# Patient Record
Sex: Female | Born: 1985 | Race: White | Hispanic: No | Marital: Married | Smoking: Never smoker
Health system: Southern US, Community
[De-identification: ages and names within clinical notes are randomized; demographics above are authoritative.]

## PROBLEM LIST (undated history)

## (undated) DIAGNOSIS — S0630AA Unspecified focal traumatic brain injury with loss of consciousness status unknown, initial encounter: Secondary | ICD-10-CM

## (undated) DIAGNOSIS — S060XAA Concussion with loss of consciousness status unknown, initial encounter: Secondary | ICD-10-CM

## (undated) DIAGNOSIS — S069X9A Unspecified intracranial injury with loss of consciousness of unspecified duration, initial encounter: Secondary | ICD-10-CM

## (undated) DIAGNOSIS — S060X9A Concussion with loss of consciousness of unspecified duration, initial encounter: Secondary | ICD-10-CM

---
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
## 2005-03-04 ENCOUNTER — Emergency Department (HOSPITAL_COMMUNITY): Admission: EM | Admit: 2005-03-04 | Discharge: 2005-03-04 | Payer: Self-pay | Admitting: Emergency Medicine

## 2005-04-19 ENCOUNTER — Emergency Department (HOSPITAL_COMMUNITY): Admission: EM | Admit: 2005-04-19 | Discharge: 2005-04-19 | Payer: Self-pay | Admitting: Emergency Medicine

## 2005-06-26 ENCOUNTER — Emergency Department (HOSPITAL_COMMUNITY): Admission: EM | Admit: 2005-06-26 | Discharge: 2005-06-26 | Payer: Self-pay | Admitting: Emergency Medicine

## 2005-09-21 ENCOUNTER — Emergency Department (HOSPITAL_COMMUNITY): Admission: EM | Admit: 2005-09-21 | Discharge: 2005-09-21 | Payer: Self-pay | Admitting: Emergency Medicine

## 2006-01-28 ENCOUNTER — Emergency Department (HOSPITAL_COMMUNITY): Admission: EM | Admit: 2006-01-28 | Discharge: 2006-01-29 | Payer: Self-pay | Admitting: Emergency Medicine

## 2007-09-25 ENCOUNTER — Encounter: Admission: RE | Admit: 2007-09-25 | Discharge: 2007-12-24 | Payer: Self-pay | Admitting: Psychology

## 2010-02-02 ENCOUNTER — Emergency Department (HOSPITAL_COMMUNITY): Admission: EM | Admit: 2010-02-02 | Discharge: 2010-02-03 | Payer: Self-pay | Admitting: Emergency Medicine

## 2013-08-05 NOTE — L&D Delivery Note (Signed)
Patient was C/C/+2 and pushed for <5 minutes with epidural.   NSVD  female infant, Apgars/weight pending The patient had 2 small bilateral vaginal wall lacerations repaired with 2 figures of eight 3-0 vicryl. Fundus was firm. EBL was expected amount. Placenta was delivered intact. Vagina was clear.  Baby was vigorous and doing skin to skin with mother.  Philip AspenALLAHAN, Skarlet Lyons

## 2014-04-28 LAB — OB RESULTS CONSOLE GC/CHLAMYDIA
Chlamydia: NEGATIVE
Gonorrhea: NEGATIVE

## 2014-05-05 LAB — OB RESULTS CONSOLE HIV ANTIBODY (ROUTINE TESTING): HIV: NONREACTIVE

## 2014-05-05 LAB — OB RESULTS CONSOLE ABO/RH: RH Type: POSITIVE

## 2014-05-05 LAB — OB RESULTS CONSOLE RUBELLA ANTIBODY, IGM: RUBELLA: IMMUNE

## 2014-05-05 LAB — OB RESULTS CONSOLE ANTIBODY SCREEN: Antibody Screen: NEGATIVE

## 2014-05-05 LAB — OB RESULTS CONSOLE RPR: RPR: NONREACTIVE

## 2014-05-05 LAB — OB RESULTS CONSOLE HEPATITIS B SURFACE ANTIGEN: Hepatitis B Surface Ag: NEGATIVE

## 2014-05-11 ENCOUNTER — Encounter (HOSPITAL_COMMUNITY): Payer: Self-pay | Admitting: Emergency Medicine

## 2014-05-11 ENCOUNTER — Emergency Department (HOSPITAL_COMMUNITY): Payer: Medicaid Other

## 2014-05-11 ENCOUNTER — Emergency Department (HOSPITAL_COMMUNITY)
Admission: EM | Admit: 2014-05-11 | Discharge: 2014-05-11 | Disposition: A | Discharge status: Home / Self Care | Payer: Medicaid Other | Attending: Emergency Medicine | Admitting: Emergency Medicine

## 2014-05-11 DIAGNOSIS — N859 Noninflammatory disorder of uterus, unspecified: Secondary | ICD-10-CM

## 2014-05-11 DIAGNOSIS — Z79899 Other long term (current) drug therapy: Secondary | ICD-10-CM | POA: Diagnosis not present

## 2014-05-11 DIAGNOSIS — N858 Other specified noninflammatory disorders of uterus: Secondary | ICD-10-CM | POA: Diagnosis not present

## 2014-05-11 DIAGNOSIS — Z7951 Long term (current) use of inhaled steroids: Secondary | ICD-10-CM | POA: Diagnosis not present

## 2014-05-11 DIAGNOSIS — Z3A27 27 weeks gestation of pregnancy: Secondary | ICD-10-CM | POA: Insufficient documentation

## 2014-05-11 DIAGNOSIS — O26893 Other specified pregnancy related conditions, third trimester: Secondary | ICD-10-CM | POA: Insufficient documentation

## 2014-05-11 LAB — CBC WITH DIFFERENTIAL/PLATELET
BASOS ABS: 0 10*3/uL (ref 0.0–0.1)
BASOS PCT: 0 % (ref 0–1)
EOS ABS: 0.2 10*3/uL (ref 0.0–0.7)
EOS PCT: 1 % (ref 0–5)
HEMATOCRIT: 32.9 % — AB (ref 36.0–46.0)
Hemoglobin: 11.1 g/dL — ABNORMAL LOW (ref 12.0–15.0)
LYMPHS ABS: 2 10*3/uL (ref 0.7–4.0)
Lymphocytes Relative: 17 % (ref 12–46)
MCH: 29.9 pg (ref 26.0–34.0)
MCHC: 33.7 g/dL (ref 30.0–36.0)
MCV: 88.7 fL (ref 78.0–100.0)
MONOS PCT: 3 % (ref 3–12)
Monocytes Absolute: 0.4 10*3/uL (ref 0.1–1.0)
NEUTROS ABS: 9.1 10*3/uL — AB (ref 1.7–7.7)
NEUTROS PCT: 79 % — AB (ref 43–77)
PLATELETS: 268 10*3/uL (ref 150–400)
RBC: 3.71 MIL/uL — AB (ref 3.87–5.11)
RDW: 12.5 % (ref 11.5–15.5)
WBC: 11.7 10*3/uL — AB (ref 4.0–10.5)

## 2014-05-11 LAB — URINALYSIS, ROUTINE W REFLEX MICROSCOPIC
BILIRUBIN URINE: NEGATIVE
Glucose, UA: NEGATIVE mg/dL
HGB URINE DIPSTICK: NEGATIVE
Ketones, ur: >80 mg/dL — AB
LEUKOCYTES UA: NEGATIVE
Nitrite: NEGATIVE
Protein, ur: NEGATIVE mg/dL
SPECIFIC GRAVITY, URINE: 1.011 (ref 1.005–1.030)
Urobilinogen, UA: 0.2 mg/dL (ref 0.0–1.0)
pH: 6.5 (ref 5.0–8.0)

## 2014-05-11 LAB — COMPREHENSIVE METABOLIC PANEL
ALBUMIN: 3.2 g/dL — AB (ref 3.5–5.2)
ALK PHOS: 69 U/L (ref 39–117)
ALT: 20 U/L (ref 0–35)
ANION GAP: 18 — AB (ref 5–15)
AST: 16 U/L (ref 0–37)
BUN: 7 mg/dL (ref 6–23)
CO2: 19 mEq/L (ref 19–32)
CREATININE: 0.37 mg/dL — AB (ref 0.50–1.10)
Calcium: 9.4 mg/dL (ref 8.4–10.5)
Chloride: 101 mEq/L (ref 96–112)
GFR calc Af Amer: >90 mL/min (ref >90)
GLUCOSE: 85 mg/dL (ref 70–99)
Potassium: 3.7 mEq/L (ref 3.7–5.3)
Sodium: 138 mEq/L (ref 137–147)
Total Protein: 7.3 g/dL (ref 6.0–8.3)

## 2014-05-11 MED ORDER — SODIUM CHLORIDE 0.9 % IV BOLUS (SEPSIS)
1500.0000 mL | Freq: Once | INTRAVENOUS | Status: AC
  Administered 2014-05-11: 1500 mL via INTRAVENOUS

## 2014-05-11 MED ORDER — ONDANSETRON 8 MG PO TBDP: 8.0000 mg | ORAL_TABLET | Freq: Three times a day (TID) | ORAL | Status: DC | PRN

## 2014-05-11 NOTE — ED Provider Notes (Signed)
CSN: [id number]     Arrival date & time 05/11/14  1618 History   First MD Initiated Contact with Patient 05/11/14 1701     Chief Complaint  Patient presents with  . Abdominal Pain  . Shortness of Breath      HPI Patient complains of sharp right-sided abdominal pain that began today shortly after intercourse.  She is [redacted] weeks pregnant.  She is a G1 P0.  She's had some nausea and vomiting today.  She has no urinary frequency.  She feels as though she has not felt her baby move in some time.  She denies vaginal bleeding or loss of fluid.  She's had an uneventful pregnancy thus far.  She states she and her fianc went for a walk she felt more short of breath than usual.  She denies unilateral leg swelling.  Her breathing feels fine now.  No history of asthma.  No history of DVT or pulmonary embolism.  She denies chest pains.  She denies pleuritic symptoms on my history.  History reviewed. No pertinent past medical history. History reviewed. No pertinent past surgical history. History reviewed. No pertinent family history. History  Substance Use Topics  . Smoking status: Never Smoker   . Smokeless tobacco: Not on file  . Alcohol Use: No   OB History   Grav Para Term Preterm Abortions TAB SAB Ect Mult Living   1              Review of Systems  All other systems reviewed and are negative.     Allergies  Azithromycin  Home Medications   Prior to Admission medications   Medication Sig Start Date End Date Taking? Authorizing Provider  desloratadine-pseudoephedrine (CLARINEX-D 12-HOUR) 2.5-120 MG per tablet Take 1 tablet by mouth 2 (two) times daily.   Yes Historical Provider, MD  mometasone (NASONEX) 50 MCG/ACT nasal spray Place 2 sprays into the nose daily.   Yes Historical Provider, MD  Prenat w/o A-FeCbn-DSS-FA-DHA (CITRANATAL HARMONY PO) Take 2 tablets by mouth daily.   Yes Historical Provider, MD   BP 138/77  Pulse 105  Temp(Src) 98.3 F (36.8 C) (Oral)  Resp 20  SpO2  99% Physical Exam  Nursing note and vitals reviewed. Constitutional: She is oriented to person, place, and time. She appears well-developed and well-nourished. No distress.  HENT:  Head: Normocephalic and atraumatic.  Eyes: EOM are normal.  Neck: Normal range of motion.  Cardiovascular: Normal rate, regular rhythm and normal heart sounds.   Pulmonary/Chest: Effort normal and breath sounds normal.  Abdominal: Soft. She exhibits no distension. There is no tenderness.  Gravid uterus consistent with dates  Musculoskeletal: Normal range of motion.  Neurological: She is alert and oriented to person, place, and time.  Skin: Skin is warm and dry.  Psychiatric: She has a normal mood and affect. Judgment normal.    ED Course  Procedures (including critical care time) Labs Review Labs Reviewed  CBC WITH DIFFERENTIAL - Abnormal; Notable for the following:    WBC 11.7 (*)    RBC 3.71 (*)    Hemoglobin 11.1 (*)    HCT 32.9 (*)    Neutrophils Relative % 79 (*)    Neutro Abs 9.1 (*)    All other components within normal limits  COMPREHENSIVE METABOLIC PANEL - Abnormal; Notable for the following:    Creatinine, Ser 0.37 (*)    Albumin 3.2 (*)    Total Bilirubin <0.2 (*)    Anion gap 18 (*)  All other components within normal limits  URINALYSIS, ROUTINE W REFLEX MICROSCOPIC - Abnormal; Notable for the following:    Ketones, ur >80 (*)    All other components within normal limits    Imaging Review Dg Chest 1 View  05/11/2014   CLINICAL DATA:  Shortness of breath.  Patient is [redacted] weeks pregnant.  EXAM: CHEST - 1 VIEW  COMPARISON:  None.  FINDINGS: The cardiac silhouette, mediastinal and hilar contours are normal. The lungs are clear. No pleural effusion the bony thorax is intact.  IMPRESSION: No acute cardiopulmonary findings.   Electronically Signed   By: Loralie ChampagneMark  Gallerani M.D.   On: 05/11/2014 19:09     EKG Interpretation None      MDM   Final diagnoses:  Uterine irritability     Bedside ultrasound demonstrates normal fetal movement and fetal heart rate of 146.  Patient was placed on continuous toco.  Rapid response OB nursing was at bedside.  Patient appears to have uterine irritability on her tocometry.  Decreased oral intake today.  Hydrated in the emergency department.  She's feeling much better at this time.  She ate food.  She ambulated without any difficulty.  She denies shortness of breath.  No hypoxia.  Discharge home in good condition.  She understands to return to the ER for new or worsening symptoms    Lyanne CoKevin M Enis Riecke, MD 05/11/14 2042

## 2014-05-11 NOTE — ED Notes (Signed)
OB RR nurse at bedside 

## 2014-05-11 NOTE — Progress Notes (Signed)
FHT 145bpm, 10x10 accels, no decels. No new contractions detected. Pt states she feels much better. Pt off monitor to eat and walk.

## 2014-05-11 NOTE — ED Notes (Signed)
Pt presented to ER with c/o SOB (since 1200) and decreased FM.(since 1400).  Pt has also had N/V.  Pt denies and vaginal bleeding or discharge.  Pt C/O sharp pain in LRQ.  Pt presently on monitor since 1624 baseline fhr 145, minimal varability, no accels, no decels, no uc per EFM or palpation

## 2014-05-11 NOTE — ED Notes (Signed)
Not[facility] Dr Claiborne Billingsallahan [facility]  MD would like RN to perform a cervical exam.

## 2014-05-11 NOTE — ED Notes (Signed)
FHR 145, minimum to moderate varibility no accels, no decels, uc noted q 3-6 min, 40 sec duration and mild to palpation.  Discussed fhr tracing with MD

## 2014-05-11 NOTE — ED Notes (Signed)
Pt states that she is [redacted] wks pregnant.  Started having LLQ pain w/ vomiting and SOB on deep inspiration about noon today.

## 2014-06-06 ENCOUNTER — Encounter (HOSPITAL_COMMUNITY): Payer: Self-pay | Admitting: Emergency Medicine

## 2014-07-18 LAB — OB RESULTS CONSOLE GBS: STREP GROUP B AG: NEGATIVE

## 2014-07-25 ENCOUNTER — Inpatient Hospital Stay (HOSPITAL_COMMUNITY)
Admission: AD | Admit: 2014-07-25 | Discharge: 2014-07-27 | DRG: 775 | Disposition: A | Discharge status: Home / Self Care | Payer: Medicaid Other | Source: Ambulatory Visit | Attending: Obstetrics & Gynecology | Admitting: Obstetrics & Gynecology

## 2014-07-25 ENCOUNTER — Inpatient Hospital Stay (HOSPITAL_COMMUNITY): Payer: Medicaid Other | Admitting: Anesthesiology

## 2014-07-25 ENCOUNTER — Encounter (HOSPITAL_COMMUNITY): Payer: Self-pay | Admitting: *Deleted

## 2014-07-25 DIAGNOSIS — Z3A38 38 weeks gestation of pregnancy: Secondary | ICD-10-CM | POA: Diagnosis present

## 2014-07-25 DIAGNOSIS — Z3403 Encounter for supervision of normal first pregnancy, third trimester: Secondary | ICD-10-CM | POA: Diagnosis present

## 2014-07-25 HISTORY — DX: Concussion with loss of consciousness of unspecified duration, initial encounter: S06.0X9A

## 2014-07-25 HISTORY — DX: Unspecified intracranial injury with loss of consciousness of unspecified duration, initial encounter: S06.9X9A

## 2014-07-25 HISTORY — DX: Unspecified focal traumatic brain injury with loss of consciousness status unknown, initial encounter: S06.30AA

## 2014-07-25 HISTORY — DX: Concussion with loss of consciousness status unknown, initial encounter: S06.0XAA

## 2014-07-25 LAB — POCT FERN TEST
POCT FERN TEST: POSITIVE
POCT Fern Test: NEGATIVE

## 2014-07-25 LAB — COMPREHENSIVE METABOLIC PANEL
ALBUMIN: 2.7 g/dL — AB (ref 3.5–5.2)
ALT: 50 U/L — AB (ref 0–35)
AST: 36 U/L (ref 0–37)
Alkaline Phosphatase: 205 U/L — ABNORMAL HIGH (ref 39–117)
Anion gap: 14 (ref 5–15)
BUN: 14 mg/dL (ref 6–23)
CALCIUM: 9.4 mg/dL (ref 8.4–10.5)
CO2: 21 mEq/L (ref 19–32)
CREATININE: 0.64 mg/dL (ref 0.50–1.10)
Chloride: 104 mEq/L (ref 96–112)
GFR calc Af Amer: >90 mL/min (ref >90)
GFR calc non Af Amer: >90 mL/min (ref >90)
Glucose, Bld: 78 mg/dL (ref 70–99)
Potassium: 4 mEq/L (ref 3.7–5.3)
Sodium: 139 mEq/L (ref 137–147)
Total Bilirubin: <0.2 mg/dL — ABNORMAL LOW (ref 0.3–1.2)
Total Protein: 5.9 g/dL — ABNORMAL LOW (ref 6.0–8.3)

## 2014-07-25 LAB — CBC
HCT: 32.6 % — ABNORMAL LOW (ref 36.0–46.0)
HEMOGLOBIN: 11 g/dL — AB (ref 12.0–15.0)
MCH: 30.1 pg (ref 26.0–34.0)
MCHC: 33.7 g/dL (ref 30.0–36.0)
MCV: 89.3 fL (ref 78.0–100.0)
PLATELETS: 195 10*3/uL (ref 150–400)
RBC: 3.65 MIL/uL — ABNORMAL LOW (ref 3.87–5.11)
RDW: 14.1 % (ref 11.5–15.5)
WBC: 10.2 10*3/uL (ref 4.0–10.5)

## 2014-07-25 LAB — TYPE AND SCREEN
ABO/RH(D): A POS
ANTIBODY SCREEN: NEGATIVE

## 2014-07-25 LAB — RPR

## 2014-07-25 LAB — ABO/RH: ABO/RH(D): A POS

## 2014-07-25 MED ORDER — OXYCODONE-ACETAMINOPHEN 5-325 MG PO TABS: 2.0000 | ORAL_TABLET | ORAL | Status: DC | PRN

## 2014-07-25 MED ORDER — FENTANYL 2.5 MCG/ML BUPIVACAINE 1/10 % EPIDURAL INFUSION (WH - ANES)
INTRAMUSCULAR | Status: AC
  Filled 2014-07-25: qty 125

## 2014-07-25 MED ORDER — PHENYLEPHRINE 40 MCG/ML (10ML) SYRINGE FOR IV PUSH (FOR BLOOD PRESSURE SUPPORT)
80.0000 ug | PREFILLED_SYRINGE | INTRAVENOUS | Status: DC | PRN
  Filled 2014-07-25: qty 2

## 2014-07-25 MED ORDER — ONDANSETRON HCL 4 MG/2ML IJ SOLN: 4.0000 mg | INTRAMUSCULAR | Status: DC | PRN

## 2014-07-25 MED ORDER — FENTANYL 2.5 MCG/ML BUPIVACAINE 1/10 % EPIDURAL INFUSION (WH - ANES)
INTRAMUSCULAR | Status: DC | PRN
  Administered 2014-07-25: 14 mL/h via EPIDURAL

## 2014-07-25 MED ORDER — PRENATAL MULTIVITAMIN CH
1.0000 | ORAL_TABLET | Freq: Every day | ORAL | Status: DC
  Administered 2014-07-26 – 2014-07-27 (×2): 1 via ORAL
  Filled 2014-07-25 (×2): qty 1

## 2014-07-25 MED ORDER — OXYTOCIN 40 UNITS IN LACTATED RINGERS INFUSION - SIMPLE MED
1.0000 m[IU]/min | INTRAVENOUS | Status: DC
  Administered 2014-07-25: 4 m[IU]/min via INTRAVENOUS
  Administered 2014-07-25: 8 m[IU]/min via INTRAVENOUS
  Administered 2014-07-25: 6 m[IU]/min via INTRAVENOUS
  Administered 2014-07-25: 10 m[IU]/min via INTRAVENOUS
  Administered 2014-07-25: 12 m[IU]/min via INTRAVENOUS
  Administered 2014-07-25: 2 m[IU]/min via INTRAVENOUS

## 2014-07-25 MED ORDER — LIDOCAINE HCL (PF) 1 % IJ SOLN
30.0000 mL | INTRAMUSCULAR | Status: DC | PRN
  Filled 2014-07-25: qty 30

## 2014-07-25 MED ORDER — ACETAMINOPHEN 325 MG PO TABS
650.0000 mg | ORAL_TABLET | ORAL | Status: DC | PRN
  Administered 2014-07-25 (×2): 650 mg via ORAL
  Filled 2014-07-25 (×2): qty 2

## 2014-07-25 MED ORDER — LACTATED RINGERS IV SOLN
INTRAVENOUS | Status: DC
  Administered 2014-07-25 (×2): via INTRAVENOUS

## 2014-07-25 MED ORDER — DIPHENHYDRAMINE HCL 25 MG PO CAPS: 25.0000 mg | ORAL_CAPSULE | Freq: Four times a day (QID) | ORAL | Status: DC | PRN

## 2014-07-25 MED ORDER — ONDANSETRON HCL 4 MG/2ML IJ SOLN
4.0000 mg | Freq: Four times a day (QID) | INTRAMUSCULAR | Status: DC | PRN
  Administered 2014-07-25: 4 mg via INTRAVENOUS
  Filled 2014-07-25: qty 2

## 2014-07-25 MED ORDER — LACTATED RINGERS IV SOLN
500.0000 mL | Freq: Once | INTRAVENOUS | Status: AC
  Administered 2014-07-25: 500 mL via INTRAVENOUS

## 2014-07-25 MED ORDER — TERBUTALINE SULFATE 1 MG/ML IJ SOLN: 0.2500 mg | Freq: Once | INTRAMUSCULAR | Status: DC | PRN

## 2014-07-25 MED ORDER — CITRIC ACID-SODIUM CITRATE 334-500 MG/5ML PO SOLN: 30.0000 mL | ORAL | Status: DC | PRN

## 2014-07-25 MED ORDER — OXYCODONE-ACETAMINOPHEN 5-325 MG PO TABS: 1.0000 | ORAL_TABLET | ORAL | Status: DC | PRN

## 2014-07-25 MED ORDER — FLEET ENEMA 7-19 GM/118ML RE ENEM: 1.0000 | ENEMA | RECTAL | Status: DC | PRN

## 2014-07-25 MED ORDER — FENTANYL 2.5 MCG/ML BUPIVACAINE 1/10 % EPIDURAL INFUSION (WH - ANES)
14.0000 mL/h | INTRAMUSCULAR | Status: DC | PRN
  Administered 2014-07-25 (×2): 14 mL/h via EPIDURAL
  Filled 2014-07-25: qty 125

## 2014-07-25 MED ORDER — LANOLIN HYDROUS EX OINT: TOPICAL_OINTMENT | CUTANEOUS | Status: DC | PRN

## 2014-07-25 MED ORDER — BENZOCAINE-MENTHOL 20-0.5 % EX AERO
1.0000 "application " | INHALATION_SPRAY | CUTANEOUS | Status: DC | PRN
  Administered 2014-07-26 – 2014-07-27 (×2): 1 via TOPICAL
  Filled 2014-07-25 (×2): qty 56

## 2014-07-25 MED ORDER — IBUPROFEN 600 MG PO TABS
600.0000 mg | ORAL_TABLET | Freq: Four times a day (QID) | ORAL | Status: DC
  Administered 2014-07-26 – 2014-07-27 (×7): 600 mg via ORAL
  Filled 2014-07-25 (×7): qty 1

## 2014-07-25 MED ORDER — DIPHENHYDRAMINE HCL 50 MG/ML IJ SOLN: 12.5000 mg | INTRAMUSCULAR | Status: DC | PRN

## 2014-07-25 MED ORDER — OXYCODONE-ACETAMINOPHEN 5-325 MG PO TABS
1.0000 | ORAL_TABLET | ORAL | Status: DC | PRN
  Administered 2014-07-26: 1 via ORAL
  Filled 2014-07-25: qty 1

## 2014-07-25 MED ORDER — SIMETHICONE 80 MG PO CHEW: 80.0000 mg | CHEWABLE_TABLET | ORAL | Status: DC | PRN

## 2014-07-25 MED ORDER — BUTORPHANOL TARTRATE 1 MG/ML IJ SOLN
1.0000 mg | INTRAMUSCULAR | Status: DC | PRN
  Administered 2014-07-25 (×2): 1 mg via INTRAVENOUS
  Filled 2014-07-25 (×2): qty 1

## 2014-07-25 MED ORDER — EPHEDRINE 5 MG/ML INJ
10.0000 mg | INTRAVENOUS | Status: DC | PRN
  Filled 2014-07-25: qty 2

## 2014-07-25 MED ORDER — OXYTOCIN 40 UNITS IN LACTATED RINGERS INFUSION - SIMPLE MED
62.5000 mL/h | INTRAVENOUS | Status: DC
Start: 2014-07-25 — End: 2014-07-25
  Administered 2014-07-25: 62.5 mL/h via INTRAVENOUS
  Filled 2014-07-25: qty 1000

## 2014-07-25 MED ORDER — OXYTOCIN BOLUS FROM INFUSION
500.0000 mL | INTRAVENOUS | Status: DC
Start: 2014-07-25 — End: 2014-07-25
  Administered 2014-07-25: 500 mL via INTRAVENOUS

## 2014-07-25 MED ORDER — ONDANSETRON HCL 4 MG PO TABS: 4.0000 mg | ORAL_TABLET | ORAL | Status: DC | PRN

## 2014-07-25 MED ORDER — DIBUCAINE 1 % RE OINT: 1.0000 "application " | TOPICAL_OINTMENT | RECTAL | Status: DC | PRN

## 2014-07-25 MED ORDER — ZOLPIDEM TARTRATE 5 MG PO TABS: 5.0000 mg | ORAL_TABLET | Freq: Every evening | ORAL | Status: DC | PRN

## 2014-07-25 MED ORDER — SENNOSIDES-DOCUSATE SODIUM 8.6-50 MG PO TABS
2.0000 | ORAL_TABLET | ORAL | Status: DC
  Administered 2014-07-26 – 2014-07-27 (×2): 2 via ORAL
  Filled 2014-07-25 (×2): qty 2

## 2014-07-25 MED ORDER — LIDOCAINE HCL (PF) 1 % IJ SOLN
INTRAMUSCULAR | Status: DC | PRN
  Administered 2014-07-25 (×2): 8 mL

## 2014-07-25 MED ORDER — PHENYLEPHRINE 40 MCG/ML (10ML) SYRINGE FOR IV PUSH (FOR BLOOD PRESSURE SUPPORT)
PREFILLED_SYRINGE | INTRAVENOUS | Status: AC
  Filled 2014-07-25: qty 10

## 2014-07-25 MED ORDER — OXYCODONE-ACETAMINOPHEN 5-325 MG PO TABS
2.0000 | ORAL_TABLET | ORAL | Status: DC | PRN
  Administered 2014-07-27 (×2): 2 via ORAL
  Filled 2014-07-25 (×2): qty 2

## 2014-07-25 MED ORDER — TETANUS-DIPHTH-ACELL PERTUSSIS 5-2.5-18.5 LF-MCG/0.5 IM SUSP: 0.5000 mL | Freq: Once | INTRAMUSCULAR | Status: DC

## 2014-07-25 MED ORDER — WITCH HAZEL-GLYCERIN EX PADS: 1.0000 "application " | MEDICATED_PAD | CUTANEOUS | Status: DC | PRN

## 2014-07-25 MED ORDER — LACTATED RINGERS IV SOLN: 500.0000 mL | INTRAVENOUS | Status: DC | PRN

## 2014-07-25 NOTE — Anesthesia Preprocedure Evaluation (Signed)
Anesthesia Evaluation  Patient identified by MRN, date of birth, ID band Patient awake    Reviewed: Allergy & Precautions, H&P , NPO status , Patient's Chart, lab work & pertinent test results  Airway Mallampati: II TM Distance: >3 FB Neck ROM: full    Dental no notable dental hx.    Pulmonary neg pulmonary ROS,  breath sounds clear to auscultation  Pulmonary exam normal       Cardiovascular negative cardio ROS      Neuro/Psych negative neurological ROS  negative psych ROS   GI/Hepatic negative GI ROS, Neg liver ROS,   Endo/Other  negative endocrine ROS  Renal/GU negative Renal ROS     Musculoskeletal   Abdominal (+) + obese,   Peds  Hematology negative hematology ROS (+)   Anesthesia Other Findings   Reproductive/Obstetrics (+) Pregnancy                           Anesthesia Physical Anesthesia Plan  ASA: II  Anesthesia Plan: Epidural   Post-op Pain Management:    Induction:   Airway Management Planned:   Additional Equipment:   Intra-op Plan:   Post-operative Plan:   Informed Consent: I have reviewed the patients History and Physical, chart, labs and discussed the procedure including the risks, benefits and alternatives for the proposed anesthesia with the patient or authorized representative who has indicated his/her understanding and acceptance.     Plan Discussed with:   Anesthesia Plan Comments:         Anesthesia Quick Evaluation  

## 2014-07-25 NOTE — MAU Note (Signed)
Contractions and leaking watery fluid. Denies vaginal bleeding. Positive fetal movement.

## 2014-07-25 NOTE — Progress Notes (Signed)
Dr Arby BarretteHatchett notified that no order was in place for repeat CBC for epidural removal in pt due to increased pressures throughout labor.  RN gave MD most current platelet level.  MD gave orders to remove epidural now without repeat CBC.

## 2014-07-25 NOTE — MAU Provider Note (Signed)
S: Peggy Conley is a 28 y.o. G1P0 at 6978w3d who presents today with leaking of fluid. She denies any VB. She confirms fetal movement. O: VSS, afebrile Abdomen: soft, non-tender, gravid External: no lesion Vagina: pooling of clear fluid  Cervix: pink, smooth Uterus: AGA FHT: 135, moderate with 15x15 accels, no decels Toco: irregular  Results for orders placed or performed during the hospital encounter of 07/25/14 (from the past 24 hour(s))  Fern Test     Status: None   Collection Time: 07/25/14  1:36 AM  Result Value Ref Range   POCT Fern Test Negative = intact amniotic membranes   Fern Test     Status: None   Collection Time: 07/25/14  1:52 AM  Result Value Ref Range   POCT Fern Test Positive = ruptured amniotic membanes    A/P: Exam for ROM RN will report to attending MD

## 2014-07-25 NOTE — Anesthesia Procedure Notes (Signed)
Epidural Patient location during procedure: OB Start time: 07/25/2014 7:48 AM End time: 07/25/2014 7:52 AM  Staffing Anesthesiologist: Leilani AbleHATCHETT, Nikiah Goin  Preanesthetic Checklist Completed: patient identified, surgical consent, pre-op evaluation, timeout performed, IV checked, risks and benefits discussed and monitors and equipment checked  Epidural Patient position: sitting Prep: site prepped and draped and DuraPrep Patient monitoring: continuous pulse ox and blood pressure Approach: midline Location: L3-L4 Injection technique: LOR air  Needle:  Needle type: Tuohy  Needle gauge: 17 G Needle length: 9 cm and 9 Needle insertion depth: 6 cm Catheter type: closed end flexible Catheter size: 19 Gauge Catheter at skin depth: 11 cm Test dose: negative and Other  Assessment Sensory level: T9 Events: blood not aspirated, injection not painful, no injection resistance, negative IV test and no paresthesia  Additional Notes Reason for block:procedure for pain

## 2014-07-25 NOTE — Progress Notes (Signed)
Dr. Mora ApplPinn notified by telephone of pt's b/p, uc pattern, and request for pain medicine.  Orders received.  RN may start pitocin in 1 hour if no change in cervix.

## 2014-07-25 NOTE — Progress Notes (Signed)
Dr. Mora ApplPinn notified of pt's abnormal labs. B/P's reported.  Oxytocin ordered no other orders at this time.

## 2014-07-25 NOTE — Lactation Note (Signed)
This note was copied from the chart of Girl Tera PartridgeSharon Dietz Giusto. Lactation Consultation Note Initial visit at 6 hours of age.  Mom reports a few good feedings and baby has been sleepy.  Mom reports taking a breastfeeding class last week and everything is still fresh in her mind.  Reviewed hand expression with colostrum visible.  [facility] resources given and discussed.  Encouraged to feed with early cues on demand.  Early newborn behavior discussed. Baby asleep on mom STS.    Mom to call for assist as needed.    Patient Name: Girl Tera PartridgeSharon Dietz Giusto [id number]'s Date: 07/25/2014 Reason for consult: Initial assessment   Maternal Data Has patient been taught Hand Expression?: Yes Does the patient have breastfeeding experience prior to this delivery?: No  Feeding    LATCH Score/Interventions Latch: Too sleepy or reluctant, no latch achieved, no sucking elicited. Intervention(s): Skin to skin;Teach feeding cues;Waking techniques Intervention(s): Adjust position;Assist with latch;Breast massage  Audible Swallowing: None Intervention(s): Skin to skin;Hand expression  Type of Nipple: Everted at rest and after stimulation  Comfort (Breast/Nipple): Soft / non-tender     Hold (Positioning): Assistance needed to correctly position infant at breast and maintain latch. Intervention(s): Breastfeeding basics reviewed;Support Pillows;Position options;Skin to skin  LATCH Score: 5  Lactation Tools Discussed/Used     Consult Status Consult Status: Follow-up Date: 07/26/14 Follow-up type: In-patient    Beverely RisenShoptaw, Arvella MerlesJana Lynn 07/25/2014, 11:36 PM

## 2014-07-26 LAB — CBC
HCT: 31 % — ABNORMAL LOW (ref 36.0–46.0)
Hemoglobin: 10.3 g/dL — ABNORMAL LOW (ref 12.0–15.0)
MCH: 29.7 pg (ref 26.0–34.0)
MCHC: 33.2 g/dL (ref 30.0–36.0)
MCV: 89.3 fL (ref 78.0–100.0)
PLATELETS: 178 10*3/uL (ref 150–400)
RBC: 3.47 MIL/uL — ABNORMAL LOW (ref 3.87–5.11)
RDW: 14.3 % (ref 11.5–15.5)
WBC: 11.9 10*3/uL — ABNORMAL HIGH (ref 4.0–10.5)

## 2014-07-26 NOTE — Lactation Note (Signed)
This note was copied from the chart of Peggy Conley. Lactation Consultation Note  Patient Name: Peggy Conley [id number]'s Date: 07/26/2014 Reason for consult: Follow-up assessment of this mom and baby, now 28 hours pp.  Mom reports that she was able to break suction and correct baby's latch when shallow latch noted and she states her baby is nursing well on cue.  LC encouraged cue feedings and discussed supply and demand and cluster feedings to bring in and increase her milk supply.  [facility].  Baby is breastfeeding exclusively for 13-27 minutes at a feeding and output is within guidelines for this hour of life.   Maternal Data    Feeding    LATCH Score/Interventions           LATCH score=7 per RN assessment           Lactation Tools Discussed/Used   Cue feedings, cluster feedings, supply and demand for milk production  Consult Status Consult Status: Follow-up Date: 07/27/14 Follow-up type: In-patient    Warrick ParisianBryant, Carolynne Schuchard [facility] 07/26/2014, 9:11 PM

## 2014-07-26 NOTE — Anesthesia Postprocedure Evaluation (Signed)
Anesthesia Post Note  Patient: Peggy RoadsSharon E Dietz Giusto  Procedure(s) Performed: * No procedures listed *  Anesthesia type: Epidural  Patient location: Mother/Baby  Post pain: Pain level controlled  Post assessment: Post-op Vital signs reviewed  Last Vitals:  Filed Vitals:   07/26/14 0449  BP: 130/76  Pulse: 94  Temp: 36.4 C  Resp: 19    Post vital signs: Reviewed  Level of consciousness:alert  Complications: No apparent anesthesia complications

## 2014-07-26 NOTE — Progress Notes (Signed)
UR chart review completed.  

## 2014-07-26 NOTE — H&P (Signed)
28 y.o. 5341w3d  G1P1001 comes in c/o leaking fluid.  Otherwise has good fetal movement and no bleeding.  Past Medical History  Diagnosis Date  . 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 Head concussion     x5   History reviewed. No pertinent past surgical history.  OB History  Gravida Para Term Preterm AB SAB TAB Ectopic Multiple Living  1 1 1       0 1    # Outcome Date GA Lbr Len/2nd Weight Sex Delivery Anes PTL Lv  1 Term 07/25/14 5841w3d 18:05 / 00:28 3.79 kg (8 lb 5.7 oz) F Vag-Spont EPI  Y      History   Social History  . Marital Status: Single    Spouse Name: N/A    Number of Children: N/A  . Years of Education: N/A   Occupational History  . Not on file.   Social History Main Topics  . Smoking status: Never Smoker   . Smokeless tobacco: Not on file  . Alcohol Use: No  . Drug Use: No  . Sexual Activity: Yes   Other Topics Concern  . Not on file   Social History Narrative   Azithromycin    Prenatal Transfer Tool  Maternal Diabetes: No Genetic Screening: Normal Maternal Ultrasounds/Referrals: Normal Fetal Ultrasounds or other Referrals:  None Maternal Substance Abuse:  No Significant Maternal Medications:  None Significant Maternal Lab Results: Lab values include: Group B Strep negative  Other PNC: uncomplicated.    Filed Vitals:   07/26/14 0449  BP: 130/76  Pulse: 94  Temp: 97.5 F (36.4 C)  Resp: 19     Lungs/Cor:  NAD Abdomen:  soft, gravid Ex:  no cords, erythema SVE:  1.5/80/-3 at admission FHTs:  135, good STV, +10x10 Toco:  q 4+   A/P   Admit with SROM  GBS Neg  If no progression on recheck will add pitocin augmentation  Other routine care  SheltonALLAHAN, [facility]

## 2014-07-26 NOTE — Progress Notes (Signed)
PPD#1 Pt without complaints, Lochia-mild VSSAF IMP/ Stable Plan/ Routine care

## 2014-07-27 MED ORDER — DOCUSATE SODIUM 100 MG PO CAPS: 100.0000 mg | ORAL_CAPSULE | Freq: Two times a day (BID) | ORAL | Status: AC

## 2014-07-27 MED ORDER — OXYCODONE-ACETAMINOPHEN 5-325 MG PO TABS: 2.0000 | ORAL_TABLET | ORAL | Status: DC | PRN

## 2014-07-27 MED ORDER — IBUPROFEN 600 MG PO TABS: 600.0000 mg | ORAL_TABLET | Freq: Four times a day (QID) | ORAL | Status: AC | PRN

## 2014-07-27 NOTE — Discharge Instructions (Signed)
Breastfeeding Deciding to breastfeed is one of the best choices you can make for you and your baby. A change in hormones during pregnancy causes your breast tissue to grow and increases the number and size of your milk ducts. These hormones also allow proteins, sugars, and fats from your blood supply to make breast milk in your milk-producing glands. Hormones prevent breast milk from being released before your baby is born as well as prompt milk flow after birth. Once breastfeeding has begun, thoughts of your baby, as well as his or her sucking or crying, can stimulate the release of milk from your milk-producing glands.  BENEFITS OF BREASTFEEDING For Your Baby  Your first milk (colostrum) helps your baby's digestive system function better.   There are antibodies in your milk that help your baby fight off infections.   Your baby has a lower incidence of asthma, allergies, and sudden infant death syndrome.   The nutrients in breast milk are better for your baby than infant formulas and are designed uniquely for your baby's needs.   Breast milk improves your baby's brain development.   Your baby is less likely to develop other conditions, such as childhood obesity, asthma, or type 2 diabetes mellitus.  For You   Breastfeeding helps to create a very special bond between you and your baby.   Breastfeeding is convenient. Breast milk is always available at the correct temperature and costs nothing.   Breastfeeding helps to burn calories and helps you lose the weight gained during pregnancy.   Breastfeeding makes your uterus contract to its prepregnancy size faster and slows bleeding (lochia) after you give birth.   Breastfeeding helps to lower your risk of developing type 2 diabetes mellitus, osteoporosis, and breast or ovarian cancer later in life. SIGNS THAT YOUR BABY IS HUNGRY Early Signs of Hunger  Increased alertness or activity.  Stretching.  Movement of the head from  side to side.  Movement of the head and opening of the mouth when the corner of the mouth or cheek is stroked (rooting).  Increased sucking sounds, smacking lips, cooing, sighing, or squeaking.  Hand-to-mouth movements.  Increased sucking of fingers or hands. Late Signs of Hunger  Fussing.  Intermittent crying. Extreme Signs of Hunger Signs of extreme hunger will require calming and consoling before your baby will be able to breastfeed successfully. Do not wait for the following signs of extreme hunger to occur before you initiate breastfeeding:   Restlessness.  A loud, strong cry.   Screaming. BREASTFEEDING BASICS Breastfeeding Initiation  Find a comfortable place to sit or lie down, with your neck and back well supported.  Place a pillow or rolled up blanket under your baby to bring him or her to the level of your breast (if you are seated). Nursing pillows are specially designed to help support your arms and your baby while you breastfeed.  Make sure that your baby's abdomen is facing your abdomen.   Gently massage your breast. With your fingertips, massage from your chest wall toward your nipple in a circular motion. This encourages milk flow. You may need to continue this action during the feeding if your milk flows slowly.  Support your breast with 4 fingers underneath and your thumb above your nipple. Make sure your fingers are well away from your nipple and your baby's mouth.   Stroke your baby's lips gently with your finger or nipple.   When your baby's mouth is open wide enough, quickly bring your baby to your  breast, placing your entire nipple and as much of the colored area around your nipple (areola) as possible into your baby's mouth.   More areola should be visible above your baby's upper lip than below the lower lip.   Your baby's tongue should be between his or her lower gum and your breast.   Ensure that your baby's mouth is correctly positioned  around your nipple (latched). Your baby's lips should create a seal on your breast and be turned out (everted).  It is common for your baby to suck about 2-3 minutes in order to start the flow of breast milk. Latching Teaching your baby how to latch on to your breast properly is very important. An improper latch can cause nipple pain and decreased milk supply for you and poor weight gain in your baby. Also, if your baby is not latched onto your nipple properly, he or she may swallow some air during feeding. This can make your baby fussy. Burping your baby when you switch breasts during the feeding can help to get rid of the air. However, teaching your baby to latch on properly is still the best way to prevent fussiness from swallowing air while breastfeeding. Signs that your baby has successfully latched on to your nipple:    Silent tugging or silent sucking, without causing you pain.   Swallowing heard between every 3-4 sucks.    Muscle movement above and in front of his or her ears while sucking.  Signs that your baby has not successfully latched on to nipple:   Sucking sounds or smacking sounds from your baby while breastfeeding.  Nipple pain. If you think your baby has not latched on correctly, slip your finger into the corner of your baby's mouth to break the suction and place it between your baby's gums. Attempt breastfeeding initiation again. Signs of Successful Breastfeeding Signs from your baby:   A gradual decrease in the number of sucks or complete cessation of sucking.   Falling asleep.   Relaxation of his or her body.   Retention of a small amount of milk in his or her mouth.   Letting go of your breast by himself or herself. Signs from you:  Breasts that have increased in firmness, weight, and size 1-3 hours after feeding.   Breasts that are softer immediately after breastfeeding.  Increased milk volume, as well as a change in milk consistency and color by  the fifth day of breastfeeding.   Nipples that are not sore, cracked, or bleeding. Signs That Your Baby is Getting Enough Milk  Wetting at least 3 diapers in a 24-hour period. The urine should be clear and pale yellow by age 5 days.  At least 3 stools in a 24-hour period by age 5 days. The stool should be soft and yellow.  At least 3 stools in a 24-hour period by age 7 days. The stool should be seedy and yellow.  No loss of weight greater than 10% of birth weight during the first 3 days of age.  Average weight gain of 4-7 ounces (113-198 g) per week after age 4 days.  Consistent daily weight gain by age 5 days, without weight loss after the age of 2 weeks. After a feeding, your baby may spit up a small amount. This is common. BREASTFEEDING FREQUENCY AND DURATION Frequent feeding will help you make more milk and can prevent sore nipples and breast engorgement. Breastfeed when you feel the need to reduce the fullness of your breasts   or when your baby shows signs of hunger. This is called "breastfeeding on demand." Avoid introducing a pacifier to your baby while you are working to establish breastfeeding (the first 4-6 weeks after your baby is born). After this time you may choose to use a pacifier. Research has shown that pacifier use during the first year of a baby's life decreases the risk of sudden infant death syndrome (SIDS). Allow your baby to feed on each breast as long as he or she wants. Breastfeed until your baby is finished feeding. When your baby unlatches or falls asleep while feeding from the first breast, offer the second breast. Because newborns are often sleepy in the first few weeks of life, you may need to awaken your baby to get him or her to feed. Breastfeeding times will vary from baby to baby. However, the following rules can serve as a guide to help you ensure that your baby is properly fed:  Newborns (babies 59 weeks of age or younger) may breastfeed every 1-3  hours.  Newborns should not go longer than 3 hours during the day or 5 hours during the night without breastfeeding.  You should breastfeed your baby a minimum of 8 times in a 24-hour period until you begin to introduce solid foods to your baby at around 11 months of age. BREAST MILK PUMPING Pumping and storing breast milk allows you to ensure that your baby is exclusively fed your breast milk, even at times when you are unable to breastfeed. This is especially important if you are going back to work while you are still breastfeeding or when you are not able to be present during feedings. Your lactation consultant can give you guidelines on how long it is safe to store breast milk.  A breast pump is a machine that allows you to pump milk from your breast into a sterile bottle. The pumped breast milk can then be stored in a refrigerator or freezer. Some breast pumps are operated by hand, while others use electricity. Ask your lactation consultant which type will work best for you. Breast pumps can be purchased, but some hospitals and breastfeeding support groups lease breast pumps on a monthly basis. A lactation consultant can teach you how to hand express breast milk, if you prefer not to use a pump.  CARING FOR YOUR BREASTS WHILE YOU BREASTFEED Nipples can become dry, cracked, and sore while breastfeeding. The following recommendations can help keep your breasts moisturized and healthy:  Avoid using soap on your nipples.   Wear a supportive bra. Although not required, special nursing bras and tank tops are designed to allow access to your breasts for breastfeeding without taking off your entire bra or top. Avoid wearing underwire-style bras or extremely tight bras.  Air dry your nipples for 3-65minutes after each feeding.   Use only cotton bra pads to absorb leaked breast milk. Leaking of breast milk between feedings is normal.   Use lanolin on your nipples after breastfeeding. Lanolin helps to  maintain your skin's normal moisture barrier. If you use pure lanolin, you do not need to wash it off before feeding your baby again. Pure lanolin is not toxic to your baby. You may also hand express a few drops of breast milk and gently massage that milk into your nipples and allow the milk to air dry. In the first few weeks after giving birth, some women experience extremely full breasts (engorgement). Engorgement can make your breasts feel heavy, warm, and tender to the  touch. Engorgement peaks within 3-5 days after you give birth. The following recommendations can help ease engorgement:  Completely empty your breasts while breastfeeding or pumping. You may want to start by applying warm, moist heat (in the shower or with warm water-soaked hand towels) just before feeding or pumping. This increases circulation and helps the milk flow. If your baby does not completely empty your breasts while breastfeeding, pump any extra milk after he or she is finished.  Wear a snug bra (nursing or regular) or tank top for 1-2 days to signal your body to slightly decrease milk production.  Apply ice packs to your breasts, unless this is too uncomfortable for you.  Make sure that your baby is latched on and positioned properly while breastfeeding. If engorgement persists after 48 hours of following these recommendations, contact your health care provider or a Advertising copywriter. OVERALL HEALTH CARE RECOMMENDATIONS WHILE BREASTFEEDING  Eat healthy foods. Alternate between meals and snacks, eating 3 of each per day. Because what you eat affects your breast milk, some of the foods may make your baby more irritable than usual. Avoid eating these foods if you are sure that they are negatively affecting your baby.  Drink milk, fruit juice, and water to satisfy your thirst (about 10 glasses a day).   Rest often, relax, and continue to take your prenatal vitamins to prevent fatigue, stress, and anemia.  Continue  breast self-awareness checks.  Avoid chewing and smoking tobacco.  Avoid alcohol and drug use. Some medicines that may be harmful to your baby can pass through breast milk. It is important to ask your health care provider before taking any medicine, including all over-the-counter and prescription medicine as well as vitamin and herbal supplements. It is possible to become pregnant while breastfeeding. If birth control is desired, ask your health care provider about options that will be safe for your baby. SEEK MEDICAL CARE IF:   You feel like you want to stop breastfeeding or have become frustrated with breastfeeding.  You have painful breasts or nipples.  Your nipples are cracked or bleeding.  Your breasts are red, tender, or warm.  You have a swollen area on either breast.  You have a fever or chills.  You have nausea or vomiting.  You have drainage other than breast milk from your nipples.  Your breasts do not become full before feedings by the fifth day after you give birth.  You feel sad and depressed.  Your baby is too sleepy to eat well.  Your baby is having trouble sleeping.   Your baby is wetting less than 3 diapers in a 24-hour period.  Your baby has less than 3 stools in a 24-hour period.  Your baby's skin or the white part of his or her eyes becomes yellow.   Your baby is not gaining weight by 3 days of age. SEEK IMMEDIATE MEDICAL CARE IF:   Your baby is overly tired (lethargic) and does not want to wake up and feed.  Your baby develops an unexplained fever. Document Released: 07/22/2005 Document Revised: 07/27/2013 Document Reviewed: 01/13/2013 [facility] Patient Information 2015 [place], [place]. This information is not intended to replace advice given to you by your health care provider. Make sure you discuss any questions you have with your health care provider.  Breast Pumping Tips If you are breastfeeding, there may be times when you cannot feed your  baby directly. Returning to work or going on a trip are examples. Pumping allows you to store  breast milk and feed it to your baby later.  You may not get much milk when you first start to pump. Your breasts should start to make more after a few days. If you pump at the times you usually feed your baby, you may be able to keep making enough milk to feed your baby without also using formula. The more often you pump, the more milk your body will make. WHEN SHOULD I PUMP?   You can start to pump soon after you have your baby. Ask your doctor what is right for you and your baby.  If you are going back to work, start pumping a few weeks before. This gives you time to learn how to pump and to store a supply of milk.  When you are with your baby, feed your baby when he or she is hungry. Pump after each feeding.  When you are away from your baby for many hours, pump for about 15 minutes every 2-3 hours. Pump both breasts at the same time if you can.  If your baby has a formula feeding, make sure to pump close to the same time.  If you drink any alcohol, wait 2 hours before pumping. HOW DO I GET READY TO PUMP? Your let-down reflex is your body's natural reaction that makes your breast milk flow. It is easier to make your breast milk flow when you are relaxed. Try these things to help you relax:  Smell one of your baby's blankets or an item of clothing.  Look at a picture or video of your baby.  Sit in a quiet, private space.  Massage the breast you plan to pump.  Place soothing warmth on the breast.  Play relaxing music. WHAT ARE SOME BREAST PUMPING TIPS?  Wash your hands before you pump. You do not need to wash your nipples or breasts.  There are three ways to pump. You can:  Use your hand to massage and squeeze your breast.  Use a handheld manual pump.  Use an electric pump.  Make sure the suction cup on the breast pump is the right size. Place the suction cup directly over the  nipple. It can be painful or hurt your nipple if it is the wrong size or placed wrong.  Put a small amount of purified or modified lanolin on your nipple and areola if you are sore.  If you are using an electric pump, change the speed and suction power to be more comfortable.  You may need a different type of pump if pumping hurts or you do not get a lot of milk. Your doctor can help you pick what type of pump to use.  Keep a full water bottle near you always. Drinking lots of fluid helps you make more milk.  You can store your milk to use later. Pumped breast milk can be stored in a sealable, sterile container or plastic bag. Always put the date you pumped it on the container.  Milk can stay out at room temperature for up to 8 hours.  You can store your milk in the refrigerator for up to 8 days.  You can store your milk in the freezer for 3 months. Thaw frozen milk using warm water. Do not put it in the microwave.  Do not smoke. Ask your doctor for help. WHEN SHOULD I CALL MY DOCTOR?  You have a hard time pumping.  You are worried you do not make enough milk.  You have nipple pain,  soreness, or redness.  You want to take birth control pills. Document Released: 01/08/2008 Document Revised: 07/27/2013 Document Reviewed: 05/14/2013 [facility] Patient Information 2015 [place], [place]. This information is not intended to replace advice given to you by your health care provider. Make sure you discuss any questions you have with your health care provider.  Breastfeeding Deciding to breastfeed is one of the best choices you can make for you and your baby. A change in hormones during pregnancy causes your breast tissue to grow and increases the number and size of your milk ducts. These hormones also allow proteins, sugars, and fats from your blood supply to make breast milk in your milk-producing glands. Hormones prevent breast milk from being released before your baby is born as well as prompt  milk flow after birth. Once breastfeeding has begun, thoughts of your baby, as well as his or her sucking or crying, can stimulate the release of milk from your milk-producing glands.  BENEFITS OF BREASTFEEDING For Your Baby  Your first milk (colostrum) helps your baby's digestive system function better.   There are antibodies in your milk that help your baby fight off infections.   Your baby has a lower incidence of asthma, allergies, and sudden infant death syndrome.   The nutrients in breast milk are better for your baby than infant formulas and are designed uniquely for your baby's needs.   Breast milk improves your baby's brain development.   Your baby is less likely to develop other conditions, such as childhood obesity, asthma, or type 2 diabetes mellitus.  For You   Breastfeeding helps to create a very special bond between you and your baby.   Breastfeeding is convenient. Breast milk is always available at the correct temperature and costs nothing.   Breastfeeding helps to burn calories and helps you lose the weight gained during pregnancy.   Breastfeeding makes your uterus contract to its prepregnancy size faster and slows bleeding (lochia) after you give birth.   Breastfeeding helps to lower your risk of developing type 2 diabetes mellitus, osteoporosis, and breast or ovarian cancer later in life. SIGNS THAT YOUR BABY IS HUNGRY Early Signs of Hunger  Increased alertness or activity.  Stretching.  Movement of the head from side to side.  Movement of the head and opening of the mouth when the corner of the mouth or cheek is stroked (rooting).  Increased sucking sounds, smacking lips, cooing, sighing, or squeaking.  Hand-to-mouth movements.  Increased sucking of fingers or hands. Late Signs of Hunger  Fussing.  Intermittent crying. Extreme Signs of Hunger Signs of extreme hunger will require calming and consoling before your baby will be able to  breastfeed successfully. Do not wait for the following signs of extreme hunger to occur before you initiate breastfeeding:   Restlessness.  A loud, strong cry.   Screaming. BREASTFEEDING BASICS Breastfeeding Initiation  Find a comfortable place to sit or lie down, with your neck and back well supported.  Place a pillow or rolled up blanket under your baby to bring him or her to the level of your breast (if you are seated). Nursing pillows are specially designed to help support your arms and your baby while you breastfeed.  Make sure that your baby's abdomen is facing your abdomen.   Gently massage your breast. With your fingertips, massage from your chest wall toward your nipple in a circular motion. This encourages milk flow. You may need to continue this action during the feeding if your milk flows  slowly.  Support your breast with 4 fingers underneath and your thumb above your nipple. Make sure your fingers are well away from your nipple and your baby's mouth.   Stroke your baby's lips gently with your finger or nipple.   When your baby's mouth is open wide enough, quickly bring your baby to your breast, placing your entire nipple and as much of the colored area around your nipple (areola) as possible into your baby's mouth.   More areola should be visible above your baby's upper lip than below the lower lip.   Your baby's tongue should be between his or her lower gum and your breast.   Ensure that your baby's mouth is correctly positioned around your nipple (latched). Your baby's lips should create a seal on your breast and be turned out (everted).  It is common for your baby to suck about 2-3 minutes in order to start the flow of breast milk. Latching Teaching your baby how to latch on to your breast properly is very important. An improper latch can cause nipple pain and decreased milk supply for you and poor weight gain in your baby. Also, if your baby is not latched onto  your nipple properly, he or she may swallow some air during feeding. This can make your baby fussy. Burping your baby when you switch breasts during the feeding can help to get rid of the air. However, teaching your baby to latch on properly is still the best way to prevent fussiness from swallowing air while breastfeeding. Signs that your baby has successfully latched on to your nipple:    Silent tugging or silent sucking, without causing you pain.   Swallowing heard between every 3-4 sucks.    Muscle movement above and in front of his or her ears while sucking.  Signs that your baby has not successfully latched on to nipple:   Sucking sounds or smacking sounds from your baby while breastfeeding.  Nipple pain. If you think your baby has not latched on correctly, slip your finger into the corner of your baby's mouth to break the suction and place it between your baby's gums. Attempt breastfeeding initiation again. Signs of Successful Breastfeeding Signs from your baby:   A gradual decrease in the number of sucks or complete cessation of sucking.   Falling asleep.   Relaxation of his or her body.   Retention of a small amount of milk in his or her mouth.   Letting go of your breast by himself or herself. Signs from you:  Breasts that have increased in firmness, weight, and size 1-3 hours after feeding.   Breasts that are softer immediately after breastfeeding.  Increased milk volume, as well as a change in milk consistency and color by the fifth day of breastfeeding.   Nipples that are not sore, cracked, or bleeding. Signs That Your Pecola LeisureBaby is Getting Enough Milk  Wetting at least 3 diapers in a 24-hour period. The urine should be clear and pale yellow by age 312 days.  At least 3 stools in a 24-hour period by age 312 days. The stool should be soft and yellow.  At least 3 stools in a 24-hour period by age 31 days. The stool should be seedy and yellow.  No loss of weight  greater than 10% of birth weight during the first 513 days of age.  Average weight gain of 4-7 ounces (113-198 g) per week after age 17 days.  Consistent daily weight gain by age 28  days, without weight loss after the age of 2 weeks. After a feeding, your baby may spit up a small amount. This is common. BREASTFEEDING FREQUENCY AND DURATION Frequent feeding will help you make more milk and can prevent sore nipples and breast engorgement. Breastfeed when you feel the need to reduce the fullness of your breasts or when your baby shows signs of hunger. This is called "breastfeeding on demand." Avoid introducing a pacifier to your baby while you are working to establish breastfeeding (the first 4-6 weeks after your baby is born). After this time you may choose to use a pacifier. Research has shown that pacifier use during the first year of a baby's life decreases the risk of sudden infant death syndrome (SIDS). Allow your baby to feed on each breast as long as he or she wants. Breastfeed until your baby is finished feeding. When your baby unlatches or falls asleep while feeding from the first breast, offer the second breast. Because newborns are often sleepy in the first few weeks of life, you may need to awaken your baby to get him or her to feed. Breastfeeding times will vary from baby to baby. However, the following rules can serve as a guide to help you ensure that your baby is properly fed:  Newborns (babies 58 weeks of age or younger) may breastfeed every 1-3 hours.  Newborns should not go longer than 3 hours during the day or 5 hours during the night without breastfeeding.  You should breastfeed your baby a minimum of 8 times in a 24-hour period until you begin to introduce solid foods to your baby at around 43 months of age. BREAST MILK PUMPING Pumping and storing breast milk allows you to ensure that your baby is exclusively fed your breast milk, even at times when you are unable to breastfeed. This  is especially important if you are going back to work while you are still breastfeeding or when you are not able to be present during feedings. Your lactation consultant can give you guidelines on how long it is safe to store breast milk.  A breast pump is a machine that allows you to pump milk from your breast into a sterile bottle. The pumped breast milk can then be stored in a refrigerator or freezer. Some breast pumps are operated by hand, while others use electricity. Ask your lactation consultant which type will work best for you. Breast pumps can be purchased, but some hospitals and breastfeeding support groups lease breast pumps on a monthly basis. A lactation consultant can teach you how to hand express breast milk, if you prefer not to use a pump.  CARING FOR YOUR BREASTS WHILE YOU BREASTFEED Nipples can become dry, cracked, and sore while breastfeeding. The following recommendations can help keep your breasts moisturized and healthy:  Avoid using soap on your nipples.   Wear a supportive bra. Although not required, special nursing bras and tank tops are designed to allow access to your breasts for breastfeeding without taking off your entire bra or top. Avoid wearing underwire-style bras or extremely tight bras.  Air dry your nipples for 3-84minutes after each feeding.   Use only cotton bra pads to absorb leaked breast milk. Leaking of breast milk between feedings is normal.   Use lanolin on your nipples after breastfeeding. Lanolin helps to maintain your skin's normal moisture barrier. If you use pure lanolin, you do not need to wash it off before feeding your baby again. Pure lanolin is not toxic  to your baby. You may also hand express a few drops of breast milk and gently massage that milk into your nipples and allow the milk to air dry. In the first few weeks after giving birth, some women experience extremely full breasts (engorgement). Engorgement can make your breasts feel heavy,  warm, and tender to the touch. Engorgement peaks within 3-5 days after you give birth. The following recommendations can help ease engorgement:  Completely empty your breasts while breastfeeding or pumping. You may want to start by applying warm, moist heat (in the shower or with warm water-soaked hand towels) just before feeding or pumping. This increases circulation and helps the milk flow. If your baby does not completely empty your breasts while breastfeeding, pump any extra milk after he or she is finished.  Wear a snug bra (nursing or regular) or tank top for 1-2 days to signal your body to slightly decrease milk production.  Apply ice packs to your breasts, unless this is too uncomfortable for you.  Make sure that your baby is latched on and positioned properly while breastfeeding. If engorgement persists after 48 hours of following these recommendations, contact your health care provider or a Advertising copywriter. OVERALL HEALTH CARE RECOMMENDATIONS WHILE BREASTFEEDING  Eat healthy foods. Alternate between meals and snacks, eating 3 of each per day. Because what you eat affects your breast milk, some of the foods may make your baby more irritable than usual. Avoid eating these foods if you are sure that they are negatively affecting your baby.  Drink milk, fruit juice, and water to satisfy your thirst (about 10 glasses a day).   Rest often, relax, and continue to take your prenatal vitamins to prevent fatigue, stress, and anemia.  Continue breast self-awareness checks.  Avoid chewing and smoking tobacco.  Avoid alcohol and drug use. Some medicines that may be harmful to your baby can pass through breast milk. It is important to ask your health care provider before taking any medicine, including all over-the-counter and prescription medicine as well as vitamin and herbal supplements. It is possible to become pregnant while breastfeeding. If birth control is desired, ask your health  care provider about options that will be safe for your baby. SEEK MEDICAL CARE IF:   You feel like you want to stop breastfeeding or have become frustrated with breastfeeding.  You have painful breasts or nipples.  Your nipples are cracked or bleeding.  Your breasts are red, tender, or warm.  You have a swollen area on either breast.  You have a fever or chills.  You have nausea or vomiting.  You have drainage other than breast milk from your nipples.  Your breasts do not become full before feedings by the fifth day after you give birth.  You feel sad and depressed.  Your baby is too sleepy to eat well.  Your baby is having trouble sleeping.   Your baby is wetting less than 3 diapers in a 24-hour period.  Your baby has less than 3 stools in a 24-hour period.  Your baby's skin or the white part of his or her eyes becomes yellow.   Your baby is not gaining weight by 74 days of age. SEEK IMMEDIATE MEDICAL CARE IF:   Your baby is overly tired (lethargic) and does not want to wake up and feed.  Your baby develops an unexplained fever. Document Released: 07/22/2005 Document Revised: 07/27/2013 Document Reviewed: 01/13/2013 [facility] Patient Information 2015 [place], [place]. This information is not intended to replace  advice given to you by your health care provider. Make sure you discuss any questions you have with your health care provider. ° °

## 2014-07-27 NOTE — Discharge Summary (Signed)
Obstetric Discharge Summary Reason for Admission: rupture of membranes Prenatal Procedures: ultrasound Intrapartum Procedures: spontaneous vaginal delivery Postpartum Procedures: none Complications-Operative and Postpartum: none HEMOGLOBIN  Date Value Ref Range Status  07/26/2014 10.3* 12.0 - 15.0 g/dL Final   HCT  Date Value Ref Range Status  07/26/2014 31.0* 36.0 - 46.0 % Final    Physical Exam:  General: alert, cooperative and appears stated age 44Lochia: appropriate Uterine Fundus: firm  Discharge Diagnoses: Term Pregnancy-delivered  Discharge Information: Date: 07/27/2014 Activity: pelvic rest Diet: routine Medications: Ibuprofen, Colace and Percocet Condition: improved Instructions: refer to practice specific booklet Discharge to: home Follow-up Information    Follow up with Peggy Conley, [name], DO In 4 weeks.   Specialty:  Obstetrics and Gynecology   Why:  for a postpartum evaluation   Contact information:   [address] [place] [address] [phone]       Newborn Data: Live born female  Birth Weight: 8 lb 5.7 oz (3790 g) APGAR: 8, 8  Home with mother.  Peggy Conley. 07/27/2014, 9:37 AM

## 2014-07-27 NOTE — Lactation Note (Signed)
This note was copied from the chart of Girl Tera PartridgeSharon Dietz Giusto. Lactation Consultation Note  Patient Name: Girl Tera PartridgeSharon Dietz Giusto YQIHK'VToday's Date: 07/27/2014 Reason for consult: Follow-up assessment Mom reports baby is latching well to left breast but not the right breast. LC assisted Mom with positioning and latching baby to right breast. Baby demonstrated a good rhythmic suck, few noted swallows. Demonstrated breast compression to help with latch. Baby at 8% weight loss however has had multiple void/stools. Advised parents baby should be at the breast 8-12 times in 24 hours now that she is over 24 hours old. Keep baby actively nursing for 15-30 minutes, both breast each feeding when possible. Advised to monitor voids/stools. Engorgement care reviewed if needed. Advised of OP services and support group. Mom denies other questions/concerns.   Maternal Data    Feeding Feeding Type: Breast Fed Length of feed: 18 min  LATCH Score/Interventions Latch: Grasps breast easily, tongue down, lips flanged, rhythmical sucking. Intervention(s): Adjust position;Assist with latch;Breast massage;Breast compression  Audible Swallowing: A few with stimulation  Type of Nipple: Everted at rest and after stimulation  Comfort (Breast/Nipple): Filling, red/small blisters or bruises, mild/mod discomfort  Problem noted: Mild/Moderate discomfort (bruising right nipple) Interventions (Mild/moderate discomfort): Hand massage;Hand expression  Hold (Positioning): Assistance needed to correctly position infant at breast and maintain latch. Intervention(s): Breastfeeding basics reviewed;Support Pillows;Position options;Skin to skin  LATCH Score: 7  Lactation Tools Discussed/Used     Consult Status Consult Status: Complete Date: 07/27/14 Follow-up type: In-patient    Alfred LevinsGranger, Ellinore Merced Ann 07/27/2014, 9:17 AM

## 2017-07-30 ENCOUNTER — Other Ambulatory Visit: Payer: Self-pay | Admitting: Infectious Diseases

## 2017-07-30 MED ORDER — AMOXICILLIN-POT CLAVULANATE 875-125 MG PO TABS: 1.0000 | ORAL_TABLET | Freq: Two times a day (BID) | ORAL | 0 refills | Status: AC

## 2018-11-25 DIAGNOSIS — J302 Other seasonal allergic rhinitis: Secondary | ICD-10-CM | POA: Diagnosis not present

## 2019-05-28 DIAGNOSIS — J302 Other seasonal allergic rhinitis: Secondary | ICD-10-CM | POA: Diagnosis not present

## 2019-05-28 DIAGNOSIS — F909 Attention-deficit hyperactivity disorder, unspecified type: Secondary | ICD-10-CM | POA: Diagnosis not present

## 2019-05-28 DIAGNOSIS — E669 Obesity, unspecified: Secondary | ICD-10-CM | POA: Diagnosis not present

## 2019-08-02 ENCOUNTER — Other Ambulatory Visit: Payer: Self-pay

## 2019-08-02 ENCOUNTER — Ambulatory Visit: Payer: BC Managed Care – PPO | Attending: Internal Medicine

## 2019-08-02 DIAGNOSIS — Z20828 Contact with and (suspected) exposure to other viral communicable diseases: Secondary | ICD-10-CM | POA: Diagnosis not present

## 2019-08-02 DIAGNOSIS — Z20822 Contact with and (suspected) exposure to covid-19: Secondary | ICD-10-CM

## 2019-08-04 LAB — NOVEL CORONAVIRUS, NAA: SARS-CoV-2, NAA: DETECTED — AB

## 2019-08-10 ENCOUNTER — Other Ambulatory Visit: Payer: BC Managed Care – PPO

## 2019-12-23 DIAGNOSIS — M5386 Other specified dorsopathies, lumbar region: Secondary | ICD-10-CM | POA: Diagnosis not present

## 2019-12-23 DIAGNOSIS — M9902 Segmental and somatic dysfunction of thoracic region: Secondary | ICD-10-CM | POA: Diagnosis not present

## 2019-12-23 DIAGNOSIS — M531 Cervicobrachial syndrome: Secondary | ICD-10-CM | POA: Diagnosis not present

## 2019-12-23 DIAGNOSIS — M9901 Segmental and somatic dysfunction of cervical region: Secondary | ICD-10-CM | POA: Diagnosis not present

## 2019-12-24 DIAGNOSIS — M9902 Segmental and somatic dysfunction of thoracic region: Secondary | ICD-10-CM | POA: Diagnosis not present

## 2019-12-24 DIAGNOSIS — M9901 Segmental and somatic dysfunction of cervical region: Secondary | ICD-10-CM | POA: Diagnosis not present

## 2019-12-24 DIAGNOSIS — M5386 Other specified dorsopathies, lumbar region: Secondary | ICD-10-CM | POA: Diagnosis not present

## 2019-12-24 DIAGNOSIS — M531 Cervicobrachial syndrome: Secondary | ICD-10-CM | POA: Diagnosis not present

## 2020-01-05 DIAGNOSIS — F909 Attention-deficit hyperactivity disorder, unspecified type: Secondary | ICD-10-CM | POA: Diagnosis not present

## 2020-01-05 DIAGNOSIS — J302 Other seasonal allergic rhinitis: Secondary | ICD-10-CM | POA: Diagnosis not present

## 2020-01-06 DIAGNOSIS — M5386 Other specified dorsopathies, lumbar region: Secondary | ICD-10-CM | POA: Diagnosis not present

## 2020-01-06 DIAGNOSIS — M531 Cervicobrachial syndrome: Secondary | ICD-10-CM | POA: Diagnosis not present

## 2020-01-06 DIAGNOSIS — M9901 Segmental and somatic dysfunction of cervical region: Secondary | ICD-10-CM | POA: Diagnosis not present

## 2020-01-06 DIAGNOSIS — M9902 Segmental and somatic dysfunction of thoracic region: Secondary | ICD-10-CM | POA: Diagnosis not present

## 2020-01-11 DIAGNOSIS — M531 Cervicobrachial syndrome: Secondary | ICD-10-CM | POA: Diagnosis not present

## 2020-01-11 DIAGNOSIS — M5386 Other specified dorsopathies, lumbar region: Secondary | ICD-10-CM | POA: Diagnosis not present

## 2020-01-11 DIAGNOSIS — M9901 Segmental and somatic dysfunction of cervical region: Secondary | ICD-10-CM | POA: Diagnosis not present

## 2020-01-11 DIAGNOSIS — M9902 Segmental and somatic dysfunction of thoracic region: Secondary | ICD-10-CM | POA: Diagnosis not present

## 2020-01-12 DIAGNOSIS — Z20822 Contact with and (suspected) exposure to covid-19: Secondary | ICD-10-CM | POA: Diagnosis not present

## 2020-01-13 DIAGNOSIS — M9901 Segmental and somatic dysfunction of cervical region: Secondary | ICD-10-CM | POA: Diagnosis not present

## 2020-01-13 DIAGNOSIS — M5386 Other specified dorsopathies, lumbar region: Secondary | ICD-10-CM | POA: Diagnosis not present

## 2020-01-13 DIAGNOSIS — M9902 Segmental and somatic dysfunction of thoracic region: Secondary | ICD-10-CM | POA: Diagnosis not present

## 2020-01-13 DIAGNOSIS — M531 Cervicobrachial syndrome: Secondary | ICD-10-CM | POA: Diagnosis not present

## 2020-02-11 DIAGNOSIS — M9902 Segmental and somatic dysfunction of thoracic region: Secondary | ICD-10-CM | POA: Diagnosis not present

## 2020-02-11 DIAGNOSIS — M9901 Segmental and somatic dysfunction of cervical region: Secondary | ICD-10-CM | POA: Diagnosis not present

## 2020-02-11 DIAGNOSIS — M5386 Other specified dorsopathies, lumbar region: Secondary | ICD-10-CM | POA: Diagnosis not present

## 2020-02-11 DIAGNOSIS — M531 Cervicobrachial syndrome: Secondary | ICD-10-CM | POA: Diagnosis not present

## 2020-02-22 DIAGNOSIS — M5386 Other specified dorsopathies, lumbar region: Secondary | ICD-10-CM | POA: Diagnosis not present

## 2020-02-22 DIAGNOSIS — M531 Cervicobrachial syndrome: Secondary | ICD-10-CM | POA: Diagnosis not present

## 2020-02-22 DIAGNOSIS — M9901 Segmental and somatic dysfunction of cervical region: Secondary | ICD-10-CM | POA: Diagnosis not present

## 2020-02-22 DIAGNOSIS — M9902 Segmental and somatic dysfunction of thoracic region: Secondary | ICD-10-CM | POA: Diagnosis not present

## 2020-04-11 DIAGNOSIS — Z1152 Encounter for screening for COVID-19: Secondary | ICD-10-CM | POA: Diagnosis not present

## 2020-04-25 DIAGNOSIS — Z3201 Encounter for pregnancy test, result positive: Secondary | ICD-10-CM | POA: Diagnosis not present

## 2020-04-27 DIAGNOSIS — N925 Other specified irregular menstruation: Secondary | ICD-10-CM | POA: Diagnosis not present

## 2020-05-19 DIAGNOSIS — O26849 Uterine size-date discrepancy, unspecified trimester: Secondary | ICD-10-CM | POA: Diagnosis not present

## 2020-05-19 DIAGNOSIS — Z124 Encounter for screening for malignant neoplasm of cervix: Secondary | ICD-10-CM | POA: Diagnosis not present

## 2020-05-19 DIAGNOSIS — Z3480 Encounter for supervision of other normal pregnancy, unspecified trimester: Secondary | ICD-10-CM | POA: Diagnosis not present

## 2020-05-19 DIAGNOSIS — Z113 Encounter for screening for infections with a predominantly sexual mode of transmission: Secondary | ICD-10-CM | POA: Diagnosis not present

## 2020-05-19 LAB — OB RESULTS CONSOLE GC/CHLAMYDIA
Chlamydia: NEGATIVE
Gonorrhea: NEGATIVE

## 2020-06-06 DIAGNOSIS — Z03818 Encounter for observation for suspected exposure to other biological agents ruled out: Secondary | ICD-10-CM | POA: Diagnosis not present

## 2020-06-06 DIAGNOSIS — Z20822 Contact with and (suspected) exposure to covid-19: Secondary | ICD-10-CM | POA: Diagnosis not present

## 2020-06-12 DIAGNOSIS — Z3481 Encounter for supervision of other normal pregnancy, first trimester: Secondary | ICD-10-CM | POA: Diagnosis not present

## 2020-06-12 DIAGNOSIS — Z3480 Encounter for supervision of other normal pregnancy, unspecified trimester: Secondary | ICD-10-CM | POA: Diagnosis not present

## 2020-06-12 DIAGNOSIS — R030 Elevated blood-pressure reading, without diagnosis of hypertension: Secondary | ICD-10-CM | POA: Diagnosis not present

## 2020-06-12 LAB — OB RESULTS CONSOLE RPR: RPR: NONREACTIVE

## 2020-06-12 LAB — OB RESULTS CONSOLE HIV ANTIBODY (ROUTINE TESTING): HIV: NONREACTIVE

## 2020-06-12 LAB — HEPATITIS C ANTIBODY: HCV Ab: NEGATIVE

## 2020-06-12 LAB — OB RESULTS CONSOLE RUBELLA ANTIBODY, IGM: Rubella: IMMUNE

## 2020-06-12 LAB — OB RESULTS CONSOLE HEPATITIS B SURFACE ANTIGEN: Hepatitis B Surface Ag: NEGATIVE

## 2020-06-26 DIAGNOSIS — Z369 Encounter for antenatal screening, unspecified: Secondary | ICD-10-CM | POA: Diagnosis not present

## 2020-07-10 DIAGNOSIS — J302 Other seasonal allergic rhinitis: Secondary | ICD-10-CM | POA: Diagnosis not present

## 2020-07-10 DIAGNOSIS — Z23 Encounter for immunization: Secondary | ICD-10-CM | POA: Diagnosis not present

## 2020-07-10 DIAGNOSIS — E669 Obesity, unspecified: Secondary | ICD-10-CM | POA: Diagnosis not present

## 2020-07-10 DIAGNOSIS — F909 Attention-deficit hyperactivity disorder, unspecified type: Secondary | ICD-10-CM | POA: Diagnosis not present

## 2020-07-26 DIAGNOSIS — Z369 Encounter for antenatal screening, unspecified: Secondary | ICD-10-CM | POA: Diagnosis not present

## 2020-07-26 DIAGNOSIS — R030 Elevated blood-pressure reading, without diagnosis of hypertension: Secondary | ICD-10-CM | POA: Diagnosis not present

## 2020-07-26 DIAGNOSIS — Z3482 Encounter for supervision of other normal pregnancy, second trimester: Secondary | ICD-10-CM | POA: Diagnosis not present

## 2020-07-31 ENCOUNTER — Other Ambulatory Visit: Payer: Self-pay | Admitting: Obstetrics and Gynecology

## 2020-07-31 DIAGNOSIS — Z363 Encounter for antenatal screening for malformations: Secondary | ICD-10-CM

## 2020-08-01 DIAGNOSIS — R030 Elevated blood-pressure reading, without diagnosis of hypertension: Secondary | ICD-10-CM | POA: Diagnosis not present

## 2020-08-05 NOTE — L&D Delivery Note (Signed)
Delivery Note At 2:30 PM a viable and healthy female was delivered via Vaginal, Spontaneous (Presentation: Left Occiput Anterior).  APGAR: 8, 9; weight 7 lb 14.6 oz (3589 g).   Placenta status: Spontaneous, Intact.  Cord: 3 vessels with the following complications: None.  Pt pushed for 8 minutes and delivered a vigorous female infant in the vertex LOA presentation with apgar scores of 8 at 1 minute and 9 at 5 minutes. After a 1 minute delay, the cord was clamped and cut. The infant remained on the maternal abdomen. The placenta delivered spontaneously, intact, with 3VC. No lacerations required repair. All sponge, needle, and instrument counts were correct  Anesthesia: Epidural Episiotomy: None Lacerations: None Suture Repair: NA Est. Blood Loss (mL): 183  Mom to postpartum.  Baby to Couplet care / Skin to Skin.  Waynard Reeds 12/28/2020, 11:36 AM

## 2020-08-17 ENCOUNTER — Encounter: Payer: Self-pay | Admitting: *Deleted

## 2020-08-21 ENCOUNTER — Ambulatory Visit: Payer: BC Managed Care – PPO

## 2020-08-23 DIAGNOSIS — Z369 Encounter for antenatal screening, unspecified: Secondary | ICD-10-CM | POA: Diagnosis not present

## 2020-08-25 DIAGNOSIS — M5489 Other dorsalgia: Secondary | ICD-10-CM | POA: Diagnosis not present

## 2020-08-31 ENCOUNTER — Ambulatory Visit: Payer: BC Managed Care – PPO | Attending: Obstetrics and Gynecology

## 2020-08-31 ENCOUNTER — Other Ambulatory Visit: Payer: Self-pay

## 2020-08-31 ENCOUNTER — Other Ambulatory Visit: Payer: Self-pay | Admitting: Obstetrics and Gynecology

## 2020-08-31 DIAGNOSIS — Z363 Encounter for antenatal screening for malformations: Secondary | ICD-10-CM

## 2020-09-01 ENCOUNTER — Other Ambulatory Visit: Payer: Self-pay | Admitting: *Deleted

## 2020-09-01 DIAGNOSIS — Z6837 Body mass index (BMI) 37.0-37.9, adult: Secondary | ICD-10-CM

## 2020-09-21 DIAGNOSIS — Z369 Encounter for antenatal screening, unspecified: Secondary | ICD-10-CM | POA: Diagnosis not present

## 2020-09-28 ENCOUNTER — Ambulatory Visit (HOSPITAL_BASED_OUTPATIENT_CLINIC_OR_DEPARTMENT_OTHER): Payer: BC Managed Care – PPO

## 2020-09-28 ENCOUNTER — Encounter: Payer: Self-pay | Admitting: *Deleted

## 2020-09-28 ENCOUNTER — Other Ambulatory Visit: Payer: Self-pay

## 2020-09-28 ENCOUNTER — Ambulatory Visit: Payer: BC Managed Care – PPO

## 2020-09-28 ENCOUNTER — Ambulatory Visit: Payer: BC Managed Care – PPO | Attending: Obstetrics and Gynecology | Admitting: *Deleted

## 2020-09-28 VITALS — BP 138/82 | HR 104

## 2020-09-28 DIAGNOSIS — Z363 Encounter for antenatal screening for malformations: Secondary | ICD-10-CM | POA: Diagnosis not present

## 2020-09-28 DIAGNOSIS — Z3A26 26 weeks gestation of pregnancy: Secondary | ICD-10-CM

## 2020-09-28 DIAGNOSIS — O99212 Obesity complicating pregnancy, second trimester: Secondary | ICD-10-CM | POA: Insufficient documentation

## 2020-09-28 DIAGNOSIS — E669 Obesity, unspecified: Secondary | ICD-10-CM | POA: Insufficient documentation

## 2020-09-28 DIAGNOSIS — O4440 Low lying placenta NOS or without hemorrhage, unspecified trimester: Secondary | ICD-10-CM

## 2020-09-28 DIAGNOSIS — Z6837 Body mass index (BMI) 37.0-37.9, adult: Secondary | ICD-10-CM

## 2020-09-29 ENCOUNTER — Other Ambulatory Visit: Payer: Self-pay | Admitting: *Deleted

## 2020-09-29 DIAGNOSIS — O4440 Low lying placenta NOS or without hemorrhage, unspecified trimester: Secondary | ICD-10-CM

## 2020-09-29 DIAGNOSIS — Z20822 Contact with and (suspected) exposure to covid-19: Secondary | ICD-10-CM | POA: Diagnosis not present

## 2020-10-12 DIAGNOSIS — Z369 Encounter for antenatal screening, unspecified: Secondary | ICD-10-CM | POA: Diagnosis not present

## 2020-10-12 DIAGNOSIS — Z3480 Encounter for supervision of other normal pregnancy, unspecified trimester: Secondary | ICD-10-CM | POA: Diagnosis not present

## 2020-10-12 LAB — OB RESULTS CONSOLE RPR: RPR: NONREACTIVE

## 2020-10-26 ENCOUNTER — Ambulatory Visit: Payer: BC Managed Care – PPO

## 2020-10-26 ENCOUNTER — Other Ambulatory Visit: Payer: BC Managed Care – PPO

## 2020-10-26 DIAGNOSIS — Z369 Encounter for antenatal screening, unspecified: Secondary | ICD-10-CM | POA: Diagnosis not present

## 2020-11-08 DIAGNOSIS — O99213 Obesity complicating pregnancy, third trimester: Secondary | ICD-10-CM | POA: Diagnosis not present

## 2020-11-08 DIAGNOSIS — O163 Unspecified maternal hypertension, third trimester: Secondary | ICD-10-CM | POA: Diagnosis not present

## 2020-11-08 DIAGNOSIS — Z3A31 31 weeks gestation of pregnancy: Secondary | ICD-10-CM | POA: Diagnosis not present

## 2020-11-22 DIAGNOSIS — O169 Unspecified maternal hypertension, unspecified trimester: Secondary | ICD-10-CM | POA: Diagnosis not present

## 2020-11-22 DIAGNOSIS — O99213 Obesity complicating pregnancy, third trimester: Secondary | ICD-10-CM | POA: Diagnosis not present

## 2020-12-01 DIAGNOSIS — O99213 Obesity complicating pregnancy, third trimester: Secondary | ICD-10-CM | POA: Diagnosis not present

## 2020-12-01 DIAGNOSIS — O163 Unspecified maternal hypertension, third trimester: Secondary | ICD-10-CM | POA: Diagnosis not present

## 2020-12-08 DIAGNOSIS — Z3480 Encounter for supervision of other normal pregnancy, unspecified trimester: Secondary | ICD-10-CM | POA: Diagnosis not present

## 2020-12-08 DIAGNOSIS — O99213 Obesity complicating pregnancy, third trimester: Secondary | ICD-10-CM | POA: Diagnosis not present

## 2020-12-08 DIAGNOSIS — O163 Unspecified maternal hypertension, third trimester: Secondary | ICD-10-CM | POA: Diagnosis not present

## 2020-12-08 LAB — OB RESULTS CONSOLE GBS: GBS: POSITIVE

## 2020-12-12 DIAGNOSIS — O163 Unspecified maternal hypertension, third trimester: Secondary | ICD-10-CM | POA: Diagnosis not present

## 2020-12-12 DIAGNOSIS — O99213 Obesity complicating pregnancy, third trimester: Secondary | ICD-10-CM | POA: Diagnosis not present

## 2020-12-14 ENCOUNTER — Other Ambulatory Visit: Payer: Self-pay

## 2020-12-14 ENCOUNTER — Inpatient Hospital Stay (HOSPITAL_COMMUNITY)
Admission: AD | Admit: 2020-12-14 | Discharge: 2020-12-18 | DRG: 807 | Disposition: A | Discharge status: Home / Self Care | Payer: BC Managed Care – PPO | Attending: Obstetrics and Gynecology | Admitting: Obstetrics and Gynecology

## 2020-12-14 ENCOUNTER — Encounter (HOSPITAL_COMMUNITY): Payer: Self-pay | Admitting: Obstetrics & Gynecology

## 2020-12-14 DIAGNOSIS — Z3A37 37 weeks gestation of pregnancy: Secondary | ICD-10-CM | POA: Diagnosis not present

## 2020-12-14 DIAGNOSIS — O119 Pre-existing hypertension with pre-eclampsia, unspecified trimester: Secondary | ICD-10-CM | POA: Diagnosis present

## 2020-12-14 DIAGNOSIS — O99824 Streptococcus B carrier state complicating childbirth: Secondary | ICD-10-CM | POA: Diagnosis present

## 2020-12-14 DIAGNOSIS — Z2882 Immunization not carried out because of caregiver refusal: Secondary | ICD-10-CM | POA: Diagnosis not present

## 2020-12-14 DIAGNOSIS — O133 Gestational [pregnancy-induced] hypertension without significant proteinuria, third trimester: Secondary | ICD-10-CM | POA: Diagnosis not present

## 2020-12-14 DIAGNOSIS — O113 Pre-existing hypertension with pre-eclampsia, third trimester: Secondary | ICD-10-CM | POA: Diagnosis not present

## 2020-12-14 DIAGNOSIS — O114 Pre-existing hypertension with pre-eclampsia, complicating childbirth: Secondary | ICD-10-CM | POA: Diagnosis not present

## 2020-12-14 DIAGNOSIS — Z20822 Contact with and (suspected) exposure to covid-19: Secondary | ICD-10-CM | POA: Diagnosis not present

## 2020-12-14 DIAGNOSIS — O1413 Severe pre-eclampsia, third trimester: Secondary | ICD-10-CM | POA: Diagnosis not present

## 2020-12-14 DIAGNOSIS — Z3689 Encounter for other specified antenatal screening: Secondary | ICD-10-CM

## 2020-12-14 DIAGNOSIS — O1002 Pre-existing essential hypertension complicating childbirth: Secondary | ICD-10-CM | POA: Diagnosis present

## 2020-12-14 DIAGNOSIS — R030 Elevated blood-pressure reading, without diagnosis of hypertension: Secondary | ICD-10-CM | POA: Diagnosis not present

## 2020-12-14 DIAGNOSIS — O99214 Obesity complicating childbirth: Secondary | ICD-10-CM | POA: Diagnosis present

## 2020-12-14 LAB — CBC WITH DIFFERENTIAL/PLATELET
Abs Immature Granulocytes: 0.04 10*3/uL (ref 0.00–0.07)
Basophils Absolute: 0 10*3/uL (ref 0.0–0.1)
Basophils Relative: 0 %
Eosinophils Absolute: 0.1 10*3/uL (ref 0.0–0.5)
Eosinophils Relative: 1 %
HCT: 33.9 % — ABNORMAL LOW (ref 36.0–46.0)
Hemoglobin: 10.9 g/dL — ABNORMAL LOW (ref 12.0–15.0)
Immature Granulocytes: 0 %
Lymphocytes Relative: 19 %
Lymphs Abs: 1.9 10*3/uL (ref 0.7–4.0)
MCH: 27.4 pg (ref 26.0–34.0)
MCHC: 32.2 g/dL (ref 30.0–36.0)
MCV: 85.2 fL (ref 80.0–100.0)
Monocytes Absolute: 0.5 10*3/uL (ref 0.1–1.0)
Monocytes Relative: 5 %
Neutro Abs: 7.5 10*3/uL (ref 1.7–7.7)
Neutrophils Relative %: 75 %
Platelets: 280 10*3/uL (ref 150–400)
RBC: 3.98 MIL/uL (ref 3.87–5.11)
RDW: 12.5 % (ref 11.5–15.5)
WBC: 10.1 10*3/uL (ref 4.0–10.5)
nRBC: 0 % (ref 0.0–0.2)

## 2020-12-14 LAB — URINALYSIS, ROUTINE W REFLEX MICROSCOPIC
Bilirubin Urine: NEGATIVE
Glucose, UA: NEGATIVE mg/dL
Hgb urine dipstick: NEGATIVE
Ketones, ur: NEGATIVE mg/dL
Nitrite: NEGATIVE
Protein, ur: NEGATIVE mg/dL
Specific Gravity, Urine: 1.015 (ref 1.005–1.030)
pH: 6 (ref 5.0–8.0)

## 2020-12-14 LAB — COMPREHENSIVE METABOLIC PANEL
ALT: 17 U/L (ref 0–44)
ALT: 15 U/L (ref 0–44)
AST: 19 U/L (ref 15–41)
AST: 17 U/L (ref 15–41)
Albumin: 2.9 g/dL — ABNORMAL LOW (ref 3.5–5.0)
Albumin: 2.6 g/dL — ABNORMAL LOW (ref 3.5–5.0)
Alkaline Phosphatase: 151 U/L — ABNORMAL HIGH (ref 38–126)
Alkaline Phosphatase: 136 U/L — ABNORMAL HIGH (ref 38–126)
Anion gap: 8 (ref 5–15)
Anion gap: 6 (ref 5–15)
BUN: 8 mg/dL (ref 6–20)
BUN: 6 mg/dL (ref 6–20)
CO2: 21 mmol/L — ABNORMAL LOW (ref 22–32)
CO2: 23 mmol/L (ref 22–32)
Calcium: 8.9 mg/dL (ref 8.9–10.3)
Calcium: 8 mg/dL — ABNORMAL LOW (ref 8.9–10.3)
Chloride: 106 mmol/L (ref 98–111)
Chloride: 106 mmol/L (ref 98–111)
Creatinine, Ser: 0.67 mg/dL (ref 0.44–1.00)
Creatinine, Ser: 0.58 mg/dL (ref 0.44–1.00)
GFR, Estimated: >60 mL/min (ref >60)
GFR, Estimated: >60 mL/min (ref >60)
Glucose, Bld: 96 mg/dL (ref 70–99)
Glucose, Bld: 106 mg/dL — ABNORMAL HIGH (ref 70–99)
Potassium: 4 mmol/L (ref 3.5–5.1)
Potassium: 3.6 mmol/L (ref 3.5–5.1)
Sodium: 135 mmol/L (ref 135–145)
Sodium: 135 mmol/L (ref 135–145)
Total Bilirubin: 0.2 mg/dL — ABNORMAL LOW (ref 0.3–1.2)
Total Bilirubin: 0.2 mg/dL — ABNORMAL LOW (ref 0.3–1.2)
Total Protein: 6.5 g/dL (ref 6.5–8.1)
Total Protein: 5.8 g/dL — ABNORMAL LOW (ref 6.5–8.1)

## 2020-12-14 LAB — CBC
HCT: 30.5 % — ABNORMAL LOW (ref 36.0–46.0)
Hemoglobin: 10 g/dL — ABNORMAL LOW (ref 12.0–15.0)
MCH: 27.7 pg (ref 26.0–34.0)
MCHC: 32.8 g/dL (ref 30.0–36.0)
MCV: 84.5 fL (ref 80.0–100.0)
Platelets: 231 10*3/uL (ref 150–400)
RBC: 3.61 MIL/uL — ABNORMAL LOW (ref 3.87–5.11)
RDW: 12.6 % (ref 11.5–15.5)
WBC: 12 10*3/uL — ABNORMAL HIGH (ref 4.0–10.5)
nRBC: 0 % (ref 0.0–0.2)

## 2020-12-14 LAB — TYPE AND SCREEN
ABO/RH(D): A POS
Antibody Screen: NEGATIVE

## 2020-12-14 LAB — PROTEIN / CREATININE RATIO, URINE
Creatinine, Urine: 112.86 mg/dL
Protein Creatinine Ratio: 0.08 mg/mg{Cre} (ref 0.00–0.15)
Total Protein, Urine: 9 mg/dL

## 2020-12-14 LAB — RESP PANEL BY RT-PCR (FLU A&B, COVID) ARPGX2
Influenza A by PCR: NEGATIVE
Influenza B by PCR: NEGATIVE
SARS Coronavirus 2 by RT PCR: NEGATIVE

## 2020-12-14 MED ORDER — OXYCODONE-ACETAMINOPHEN 5-325 MG PO TABS: 2.0000 | ORAL_TABLET | ORAL | Status: DC | PRN

## 2020-12-14 MED ORDER — OXYTOCIN-SODIUM CHLORIDE 30-0.9 UT/500ML-% IV SOLN
1.0000 m[IU]/min | INTRAVENOUS | Status: DC
  Administered 2020-12-14: 2 m[IU]/min via INTRAVENOUS
  Filled 2020-12-14: qty 500

## 2020-12-14 MED ORDER — TERBUTALINE SULFATE 1 MG/ML IJ SOLN: 0.2500 mg | Freq: Once | INTRAMUSCULAR | Status: DC | PRN

## 2020-12-14 MED ORDER — PENICILLIN G POT IN DEXTROSE 60000 UNIT/ML IV SOLN
3.0000 10*6.[IU] | INTRAVENOUS | Status: DC
  Administered 2020-12-14 – 2020-12-15 (×5): 3 10*6.[IU] via INTRAVENOUS
  Filled 2020-12-14 (×5): qty 50

## 2020-12-14 MED ORDER — LACTATED RINGERS IV SOLN: INTRAVENOUS | Status: DC

## 2020-12-14 MED ORDER — ACETAMINOPHEN 325 MG PO TABS
650.0000 mg | ORAL_TABLET | ORAL | Status: DC | PRN
  Administered 2020-12-14 – 2020-12-15 (×2): 650 mg via ORAL
  Filled 2020-12-14 (×2): qty 2

## 2020-12-14 MED ORDER — LABETALOL HCL 5 MG/ML IV SOLN
20.0000 mg | INTRAVENOUS | Status: DC | PRN
  Administered 2020-12-14 – 2020-12-16 (×2): 20 mg via INTRAVENOUS
  Filled 2020-12-14 (×3): qty 4

## 2020-12-14 MED ORDER — LACTATED RINGERS IV SOLN
500.0000 mL | INTRAVENOUS | Status: DC | PRN
  Administered 2020-12-15: 500 mL via INTRAVENOUS

## 2020-12-14 MED ORDER — SODIUM CHLORIDE 0.9 % IV SOLN
5.0000 10*6.[IU] | Freq: Once | INTRAVENOUS | Status: AC
  Administered 2020-12-14: 5 10*6.[IU] via INTRAVENOUS
  Filled 2020-12-14: qty 5

## 2020-12-14 MED ORDER — LIDOCAINE HCL (PF) 1 % IJ SOLN: 30.0000 mL | INTRAMUSCULAR | Status: DC | PRN

## 2020-12-14 MED ORDER — NIFEDIPINE ER OSMOTIC RELEASE 30 MG PO TB24
30.0000 mg | ORAL_TABLET | Freq: Every day | ORAL | Status: DC
  Filled 2020-12-14: qty 1

## 2020-12-14 MED ORDER — OXYTOCIN BOLUS FROM INFUSION
333.0000 mL | Freq: Once | INTRAVENOUS | Status: AC
  Administered 2020-12-15: 333 mL via INTRAVENOUS

## 2020-12-14 MED ORDER — LABETALOL HCL 5 MG/ML IV SOLN
20.0000 mg | INTRAVENOUS | Status: DC | PRN
  Administered 2020-12-14: 20 mg via INTRAVENOUS

## 2020-12-14 MED ORDER — OXYCODONE-ACETAMINOPHEN 5-325 MG PO TABS: 1.0000 | ORAL_TABLET | ORAL | Status: DC | PRN

## 2020-12-14 MED ORDER — OXYTOCIN-SODIUM CHLORIDE 30-0.9 UT/500ML-% IV SOLN: 2.5000 [IU]/h | INTRAVENOUS | Status: DC

## 2020-12-14 MED ORDER — LABETALOL HCL 5 MG/ML IV SOLN: 80.0000 mg | INTRAVENOUS | Status: DC | PRN

## 2020-12-14 MED ORDER — ONDANSETRON HCL 4 MG/2ML IJ SOLN
4.0000 mg | Freq: Four times a day (QID) | INTRAMUSCULAR | Status: DC | PRN
  Administered 2020-12-15: 4 mg via INTRAVENOUS
  Filled 2020-12-14: qty 2

## 2020-12-14 MED ORDER — HYDRALAZINE HCL 20 MG/ML IJ SOLN: 10.0000 mg | INTRAMUSCULAR | Status: DC | PRN

## 2020-12-14 MED ORDER — SOD CITRATE-CITRIC ACID 500-334 MG/5ML PO SOLN: 30.0000 mL | ORAL | Status: DC | PRN

## 2020-12-14 MED ORDER — LABETALOL HCL 5 MG/ML IV SOLN: 40.0000 mg | INTRAVENOUS | Status: DC | PRN

## 2020-12-14 MED ORDER — MAGNESIUM SULFATE BOLUS VIA INFUSION
4.0000 g | Freq: Once | INTRAVENOUS | Status: AC
  Administered 2020-12-14: 4 g via INTRAVENOUS
  Filled 2020-12-14: qty 1000

## 2020-12-14 MED ORDER — MAGNESIUM SULFATE 40 GM/1000ML IV SOLN
2.0000 g/h | INTRAVENOUS | Status: DC
  Administered 2020-12-15: 2 g/h via INTRAVENOUS
  Filled 2020-12-14 (×2): qty 1000

## 2020-12-14 MED ORDER — MISOPROSTOL 25 MCG QUARTER TABLET
25.0000 ug | ORAL_TABLET | ORAL | Status: DC | PRN
  Administered 2020-12-14 (×2): 25 ug via VAGINAL
  Filled 2020-12-14 (×2): qty 1

## 2020-12-14 MED ORDER — FENTANYL CITRATE (PF) 100 MCG/2ML IJ SOLN
50.0000 ug | INTRAMUSCULAR | Status: DC | PRN
  Administered 2020-12-15: 100 ug via INTRAVENOUS
  Filled 2020-12-14: qty 2

## 2020-12-14 NOTE — Progress Notes (Signed)
Called Dr. Claudine Mouton- notified of SVE, FHR, BP, pt headache-  not desiring more medications at this time.   No new orders received.

## 2020-12-14 NOTE — MAU Note (Signed)
Peggy Conley is a 35 y.o. at [redacted]w[redacted]d here in MAU reporting: sent over from the office for HTN. States BP was 150/90. Denies headache, visual changes, or RUQ pain. No contractions, bleeding, or LOF. +FM  Onset of complaint: today  Pain score: 0/10  Vitals:   12/14/20 1120  BP: (!) 176/112  Pulse: (!) 102  Resp: 16  Temp: 97.9 F (36.6 C)  SpO2: 98%     FHT: 135  Lab orders placed from triage: UA

## 2020-12-14 NOTE — MAU Provider Note (Signed)
History     CSN: [id number]  Arrival date and time: 12/14/20 1108   Event Date/Time   First Provider Initiated Contact with Patient 12/14/20 1127      Chief Complaint  Patient presents with  . Hypertension   Ms. Nhyira Leano is a 35 y.o. G2P1001 at [redacted]w[redacted]d who presents to MAU for preeclampsia evaluation after she was seen in the office with severe range pressures and sent to MAU for further evaluation. No prenatal records with weekly visit information available in chart for review of BPs this pregnancy.  Pt denies HA, blurry vision/seeing spots, N/V, epigastric pain, swelling in face and hands, sudden weight gain. Pt denies chest pain and SOB.  Pt denies constipation, diarrhea, or urinary problems. Pt denies fever, chills, fatigue, sweating or changes in appetite. Pt denies dizziness, light-headedness, weakness.  Pt denies VB, ctx, LOF and reports good FM.   OB History    Gravida  2   Para  1   Term  1   Preterm      AB      Living  1     SAB      IAB      Ectopic      Multiple  0   Live Births  1           Past Medical History:  Diagnosis Date  . Head concussion    x5  . 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 WISDOM TOOTH EXTRACTION  2005    Family History  Problem Relation Age of Onset  . Cancer Maternal Grandmother     Social History   Tobacco Use  . Smoking status: Never Smoker  . Smokeless tobacco: Never Used  Vaping Use  . Vaping Use: Never used  Substance Use Topics  . Alcohol use: No  . Drug use: No    Allergies:  Allergies  Allergen Reactions  . Azithromycin Hives    Medications Prior to Admission  Medication Sig Dispense Refill Last Dose  . docusate sodium (COLACE) 100 MG capsule Take 1 capsule (100 mg total) by mouth 2 (two) times daily. 60 capsule 0   . ibuprofen (ADVIL,MOTRIN) 600 MG tablet Take 1 tablet (600 mg total) by  mouth every 6 (six) hours as needed. (Patient not taking: Reported on 09/28/2020) 90 tablet 0   . mometasone (NASONEX) 50 MCG/ACT nasal spray Place 2 sprays into the nose daily.     Marland Kitchen oxyCODONE-acetaminophen (ROXICET) 5-325 MG per tablet Take 2 tablets by mouth every 4 (four) hours as needed. May take 1-2 tablets every 4-6 hours as needed for pain (Patient not taking: Reported on 09/28/2020) 30 tablet 0   . Prenat w/o A-FeCbn-DSS-FA-DHA (CITRANATAL HARMONY PO) Take 2 tablets by mouth daily.       Review of Systems  Constitutional: Negative for chills, diaphoresis, fatigue and fever.  Eyes: Negative for visual disturbance.  Respiratory: Negative for shortness of breath.   Cardiovascular: Negative for chest pain.  Gastrointestinal: Negative for abdominal pain, constipation, diarrhea, nausea and vomiting.  Genitourinary: Negative for dysuria, flank pain, frequency, pelvic pain, urgency, vaginal bleeding and vaginal discharge.  Neurological: Negative for dizziness, weakness, light-headedness and headaches.   Physical Exam   Blood pressure (!) 143/89, pulse 84, temperature 97.9 F (36.6 C), temperature source Oral, resp. rate 16, height 5\' 7"  (1.702 m), weight 115.8 kg, last menstrual  period 03/23/2020, SpO2 98 %, unknown if currently breastfeeding.  Patient Vitals for the past 24 hrs:  BP Temp Temp src Pulse Resp SpO2 Height Weight  12/14/20 1205 -- -- -- -- -- 98 % -- --  12/14/20 1201 (!) 143/89 -- -- 84 -- -- -- --  12/14/20 1151 124/64 -- -- 75 -- -- -- --  12/14/20 1146 (!) 183/118 -- -- (!) 106 -- -- -- --  12/14/20 1135 (!) 181/116 -- -- (!) 105 -- 98 % -- --  12/14/20 1120 (!) 176/112 97.9 F (36.6 C) Oral (!) 102 16 98 % -- --  12/14/20 1115 -- -- -- -- -- -- 5\' 7"  (1.702 m) 115.8 kg   Physical Exam Vitals and nursing note reviewed.  Constitutional:      General: She is not in acute distress.    Appearance: Normal appearance. She is not ill-appearing, toxic-appearing or  diaphoretic.  HENT:     Head: Normocephalic and atraumatic.  Pulmonary:     Effort: Pulmonary effort is normal.  Neurological:     Mental Status: She is alert and oriented to person, place, and time.  Psychiatric:        Mood and Affect: Mood normal.        Behavior: Behavior normal.        Thought Content: Thought content normal.        Judgment: Judgment normal.    MAU Course  Procedures  MDM -preeclampsia evaluation with severe range BP in MAU on admission -labetalol protocol started -PEC labs ordered -asymptomatic -called and spoke with Dr. who agrees with plan for admission -called and spoke with Dr. Alysia Penna who accepts patient for admission and will enter orders -EFM: reactive       -baseline: 150/140       -variability: moderate       -accels: present, 15x15       -decels: absent       -TOCO: few, irregular ctx -admit to L&D for induction  Assessment and Plan   1. Severe pre-eclampsia in third trimester   2. [redacted] weeks gestation of pregnancy   3. NST (non-stress test) reactive    -admit to L&D for induction  Claudine Mouton Worth Kober 12/14/2020, 12:11 PM

## 2020-12-14 NOTE — H&P (Signed)
Peggy Conley is a 35 y.o. female G2P1001 [redacted]w[redacted]d presenting for from office to MAU for eval of BP and preeclampsia labs in setting of severe range BP in the office. In MAU she continued to have severe range BP, therefore proceeding with admission to L&D for IOL due to superimposed preeclampsia by severe range BP. She reports no LOF, VB, Contractions. Normal FM. Denies HA, VC, RUQ pain, SOB  Pregnancy c/b: 1. Chronic hypertension: denies CHTN outside of pregnancy, at 10w she had mild range BP. On no meds. At [redacted]w[redacted]d EFW 63% 2. Obesity: BMI 37.6 at new OB, current BMI 39 3. Low lying placenta resolved on follow up scan 4. Taking Adderall for ADD in pregnancy  OB History    Gravida  2   Para  1   Term  1   Preterm      AB      Living  1     SAB      IAB      Ectopic      Multiple  0   Live Births  1          Past Medical History:  Diagnosis Date  . Head concussion    x5  . 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 WISDOM TOOTH EXTRACTION  2005   Family History: family history includes Cancer in her maternal grandmother. Social History:  reports that she has never smoked. She has never used smokeless tobacco. She reports that she does not drink alcohol and does not use drugs.     Maternal Diabetes: No Genetic Screening: Normal  Low risk panorama Maternal Ultrasounds/Referrals: Normal Fetal Ultrasounds or other Referrals:  Referred to Materal Fetal Medicine  for anatomy scan. Posterior placenta Maternal Substance Abuse:  No Significant Maternal Medications:  Meds include: Other: adderal use Significant Maternal Lab Results:  Group B Strep positive Other Comments:  CHTN  Review of Systems Per HPI Exam Physical Exam  Dilation: 1 Effacement (%): 70 Station: Ballotable Exam by:: Yancey Manhard RNC Blood pressure (!) 146/84, pulse 96, temperature 97.9 F (36.6 C), temperature  source Oral, resp. rate 16, height 5\' 7"  (1.702 m), weight 113.4 kg, last menstrual period 03/23/2020, SpO2 98 %, unknown if currently breastfeeding.  Patient Vitals for the past 24 hrs:  BP Temp Temp src Pulse Resp SpO2 Height Weight  12/14/20 1320 (!) 146/84 -- -- 96 16 -- -- --  12/14/20 1310 (!) 159/99 -- -- 90 17 -- -- --  12/14/20 1301 (!) 156/95 -- -- 87 16 -- -- --  12/14/20 1257 (!) 158/93 -- -- 89 17 -- -- --  12/14/20 1249 (!) 151/89 97.9 F (36.6 C) Oral 83 17 -- -- --  12/14/20 1245 -- -- -- -- -- -- 5\' 7"  (1.702 m) 113.4 kg  12/14/20 1221 (!) 123/91 -- -- 100 -- -- -- --  12/14/20 1211 (!) 149/94 -- -- 90 -- -- -- --  12/14/20 1205 -- -- -- -- -- 98 % -- --  12/14/20 1201 (!) 143/89 -- -- 84 -- -- -- --  12/14/20 1151 124/64 -- -- 75 -- -- -- --  12/14/20 1146 (!) 183/118 -- -- (!) 106 -- -- -- --  12/14/20 1135 (!) 181/116 -- -- (!) 105 -- 98 % -- --  12/14/20 1120 (!) 176/112 97.9 F (36.6 C) Oral (!) 102 16 98 % -- --  12/14/20 1115 -- -- -- -- -- -- 5\' 7"  (1.702 m) 115.8 kg    NAD, resting comfortably Gravid abdomen Card: RRR Pulm: CTAB Lower extremity trace edema, no evidence of DVT Fetal testing: FHR 130, Cat 1. Toco quiet Prenatal labs: ABO, Rh:  --/--/PENDING (05/12 1140) Antibody: PENDING (05/12 1140) Rubella: Immune (11/08 0000) RPR: Nonreactive (03/10 0000)  HBsAg: Negative (11/08 0000)  HIV: Non-reactive (11/08 0000)  GBS: Positive/-- (05/06 0000)   Chemistry Recent Labs  Lab 12/14/20 1148  NA 135  K 4.0  CL 106  CO2 21*  GLUCOSE 96  BUN 8  CREATININE 0.67  CALCIUM 8.9  GFRNONAA >60  ANIONGAP 8    Recent Labs  Lab 12/14/20 1148  PROT 6.5  ALBUMIN 2.9*  AST 19  ALT 17  ALKPHOS 151*  BILITOT 0.2*   Hematology Recent Labs  Lab 12/14/20 1148  WBC 10.1  RBC 3.98  HGB 10.9*  HCT 33.9*  MCV 85.2  MCH 27.4  MCHC 32.2  RDW 12.5  PLT 280   UPC 0.08  Assessment/Plan: Peggy Conley is a 35 y.o. female G2P1001 [redacted]w[redacted]d  admitted with superimposed preeclampsia by severe range BP.  1. IOL: SVE in the office 1/30/-3, plan for cytotec prn, followed by pitocin/AROM prn GBS positive, on PCN. 2. Superimposed preeclampsia: severe range BP on arrival to MAU, received labetalol 20mg  IV, continue to treat prn. Preeclampsia labs normal.  -Magnesium 4g, followed by 2g/h -Repeat labs in 12 hours -Monitor symptoms 3. Obesity: BMI 37.6 at new OB, current BMI 39 Vaccines: s/p tdap in pregnancy. Declined flu & COVID  Peggy Conley 12/14/2020, 2:30 PM

## 2020-12-14 NOTE — Progress Notes (Signed)
Called Dr. Claudine Mouton- notified of BP-  Plans to hold Procardia a now- will call if BP begins to increase in 140's.

## 2020-12-15 ENCOUNTER — Encounter (HOSPITAL_COMMUNITY): Payer: Self-pay | Admitting: Obstetrics and Gynecology

## 2020-12-15 ENCOUNTER — Inpatient Hospital Stay (HOSPITAL_COMMUNITY): Payer: BC Managed Care – PPO | Admitting: Anesthesiology

## 2020-12-15 LAB — CBC
HCT: 32.4 % — ABNORMAL LOW (ref 36.0–46.0)
HCT: 31.9 % — ABNORMAL LOW (ref 36.0–46.0)
Hemoglobin: 10.4 g/dL — ABNORMAL LOW (ref 12.0–15.0)
Hemoglobin: 10.1 g/dL — ABNORMAL LOW (ref 12.0–15.0)
MCH: 27.2 pg (ref 26.0–34.0)
MCH: 27.5 pg (ref 26.0–34.0)
MCHC: 32.1 g/dL (ref 30.0–36.0)
MCHC: 31.7 g/dL (ref 30.0–36.0)
MCV: 84.8 fL (ref 80.0–100.0)
MCV: 86.9 fL (ref 80.0–100.0)
Platelets: 229 10*3/uL (ref 150–400)
Platelets: 249 10*3/uL (ref 150–400)
RBC: 3.82 MIL/uL — ABNORMAL LOW (ref 3.87–5.11)
RBC: 3.67 MIL/uL — ABNORMAL LOW (ref 3.87–5.11)
RDW: 12.6 % (ref 11.5–15.5)
RDW: 12.8 % (ref 11.5–15.5)
WBC: 10.4 10*3/uL (ref 4.0–10.5)
WBC: 13.8 10*3/uL — ABNORMAL HIGH (ref 4.0–10.5)
nRBC: 0 % (ref 0.0–0.2)
nRBC: 0 % (ref 0.0–0.2)

## 2020-12-15 LAB — RPR: RPR Ser Ql: NONREACTIVE

## 2020-12-15 MED ORDER — DIPHENHYDRAMINE HCL 25 MG PO CAPS: 25.0000 mg | ORAL_CAPSULE | Freq: Four times a day (QID) | ORAL | Status: DC | PRN

## 2020-12-15 MED ORDER — WITCH HAZEL-GLYCERIN EX PADS: 1.0000 "application " | MEDICATED_PAD | CUTANEOUS | Status: DC | PRN

## 2020-12-15 MED ORDER — COCONUT OIL OIL
1.0000 "application " | TOPICAL_OIL | Status: DC | PRN
  Administered 2020-12-16: 1 via TOPICAL

## 2020-12-15 MED ORDER — PRENATAL MULTIVITAMIN CH
1.0000 | ORAL_TABLET | Freq: Every day | ORAL | Status: DC
  Administered 2020-12-16 – 2020-12-18 (×3): 1 via ORAL
  Filled 2020-12-15 (×3): qty 1

## 2020-12-15 MED ORDER — IBUPROFEN 600 MG PO TABS
600.0000 mg | ORAL_TABLET | Freq: Four times a day (QID) | ORAL | Status: DC
  Administered 2020-12-15 – 2020-12-18 (×12): 600 mg via ORAL
  Filled 2020-12-15 (×12): qty 1

## 2020-12-15 MED ORDER — TETANUS-DIPHTH-ACELL PERTUSSIS 5-2.5-18.5 LF-MCG/0.5 IM SUSY: 0.5000 mL | PREFILLED_SYRINGE | Freq: Once | INTRAMUSCULAR | Status: DC

## 2020-12-15 MED ORDER — ONDANSETRON HCL 4 MG/2ML IJ SOLN
4.0000 mg | INTRAMUSCULAR | Status: DC | PRN
  Administered 2020-12-15: 4 mg via INTRAVENOUS
  Filled 2020-12-15: qty 2

## 2020-12-15 MED ORDER — ONDANSETRON HCL 4 MG PO TABS: 4.0000 mg | ORAL_TABLET | ORAL | Status: DC | PRN

## 2020-12-15 MED ORDER — SENNOSIDES-DOCUSATE SODIUM 8.6-50 MG PO TABS
2.0000 | ORAL_TABLET | Freq: Every day | ORAL | Status: DC
  Administered 2020-12-16 – 2020-12-18 (×3): 2 via ORAL
  Filled 2020-12-15 (×3): qty 2

## 2020-12-15 MED ORDER — MAGNESIUM SULFATE 40 GM/1000ML IV SOLN
2.0000 g/h | INTRAVENOUS | Status: AC
  Administered 2020-12-15 – 2020-12-16 (×2): 2 g/h via INTRAVENOUS
  Filled 2020-12-15: qty 1000

## 2020-12-15 MED ORDER — PHENYLEPHRINE 40 MCG/ML (10ML) SYRINGE FOR IV PUSH (FOR BLOOD PRESSURE SUPPORT)
80.0000 ug | PREFILLED_SYRINGE | INTRAVENOUS | Status: DC | PRN
  Administered 2020-12-15 (×2): 80 ug via INTRAVENOUS

## 2020-12-15 MED ORDER — LIDOCAINE HCL (PF) 1 % IJ SOLN
INTRAMUSCULAR | Status: DC | PRN
  Administered 2020-12-15: 3 mL via EPIDURAL
  Administered 2020-12-15: 5 mL via EPIDURAL
  Administered 2020-12-15: 2 mL via EPIDURAL

## 2020-12-15 MED ORDER — DIBUCAINE (PERIANAL) 1 % EX OINT: 1.0000 "application " | TOPICAL_OINTMENT | CUTANEOUS | Status: DC | PRN

## 2020-12-15 MED ORDER — LACTATED RINGERS IV SOLN: INTRAVENOUS | Status: DC

## 2020-12-15 MED ORDER — NIFEDIPINE ER OSMOTIC RELEASE 30 MG PO TB24
30.0000 mg | ORAL_TABLET | Freq: Every day | ORAL | Status: DC
  Administered 2020-12-15: 30 mg via ORAL
  Filled 2020-12-15: qty 1

## 2020-12-15 MED ORDER — EPHEDRINE 5 MG/ML INJ: 10.0000 mg | INTRAVENOUS | Status: DC | PRN

## 2020-12-15 MED ORDER — ZOLPIDEM TARTRATE 5 MG PO TABS: 5.0000 mg | ORAL_TABLET | Freq: Every evening | ORAL | Status: DC | PRN

## 2020-12-15 MED ORDER — DIPHENHYDRAMINE HCL 50 MG/ML IJ SOLN: 12.5000 mg | INTRAMUSCULAR | Status: DC | PRN

## 2020-12-15 MED ORDER — OXYCODONE HCL 5 MG PO TABS
5.0000 mg | ORAL_TABLET | ORAL | Status: DC | PRN
  Administered 2020-12-16 – 2020-12-17 (×4): 5 mg via ORAL
  Filled 2020-12-15 (×2): qty 1

## 2020-12-15 MED ORDER — MISOPROSTOL 200 MCG PO TABS
ORAL_TABLET | ORAL | Status: AC
  Filled 2020-12-15: qty 2

## 2020-12-15 MED ORDER — SIMETHICONE 80 MG PO CHEW: 80.0000 mg | CHEWABLE_TABLET | ORAL | Status: DC | PRN

## 2020-12-15 MED ORDER — FENTANYL-BUPIVACAINE-NACL 0.5-0.125-0.9 MG/250ML-% EP SOLN
12.0000 mL/h | EPIDURAL | Status: DC | PRN
  Administered 2020-12-15: 12 mL/h via EPIDURAL
  Filled 2020-12-15: qty 250

## 2020-12-15 MED ORDER — ACETAMINOPHEN 325 MG PO TABS
650.0000 mg | ORAL_TABLET | ORAL | Status: DC | PRN
  Administered 2020-12-15 – 2020-12-17 (×6): 650 mg via ORAL
  Filled 2020-12-15 (×6): qty 2

## 2020-12-15 MED ORDER — NIFEDIPINE ER OSMOTIC RELEASE 30 MG PO TB24
30.0000 mg | ORAL_TABLET | Freq: Every day | ORAL | Status: DC
  Filled 2020-12-15: qty 1

## 2020-12-15 MED ORDER — BENZOCAINE-MENTHOL 20-0.5 % EX AERO
1.0000 "application " | INHALATION_SPRAY | CUTANEOUS | Status: DC | PRN
  Administered 2020-12-15 – 2020-12-17 (×2): 1 via TOPICAL
  Filled 2020-12-15 (×2): qty 56

## 2020-12-15 MED ORDER — LACTATED RINGERS IV SOLN
500.0000 mL | Freq: Once | INTRAVENOUS | Status: AC
  Administered 2020-12-15: 500 mL via INTRAVENOUS

## 2020-12-15 MED ORDER — PHENYLEPHRINE 40 MCG/ML (10ML) SYRINGE FOR IV PUSH (FOR BLOOD PRESSURE SUPPORT)
80.0000 ug | PREFILLED_SYRINGE | INTRAVENOUS | Status: DC | PRN
  Administered 2020-12-15: 80 ug via INTRAVENOUS
  Filled 2020-12-15: qty 10

## 2020-12-15 MED ORDER — OXYCODONE HCL 5 MG PO TABS
10.0000 mg | ORAL_TABLET | ORAL | Status: DC | PRN
  Filled 2020-12-15: qty 2

## 2020-12-15 NOTE — Lactation Note (Signed)
This note was copied from a baby's chart. Lactation Consultation Note LC paged to L&D. On arrival, FOB holding newborn and mom with N&V. Pt request LC return at later time. No charge. Patient Name: Peggy Conley [id number] Date: 12/15/2020   Age:35 hours     Elder Negus, MA IBCLC 12/15/2020, 2:59 PM

## 2020-12-15 NOTE — Anesthesia Preprocedure Evaluation (Signed)
Anesthesia Evaluation  Patient identified by MRN, date of birth, ID band Patient awake    Reviewed: Allergy & Precautions, Patient's Chart, lab work & pertinent test results  Airway Mallampati: III  TM Distance: >3 FB     Dental  (+) Dental Advisory Given   Pulmonary neg pulmonary ROS,    breath sounds clear to auscultation       Cardiovascular hypertension,  Rhythm:Regular Rate:Normal     Neuro/Psych negative neurological ROS     GI/Hepatic negative GI ROS, Neg liver ROS,   Endo/Other  negative endocrine ROS  Renal/GU negative Renal ROS     Musculoskeletal   Abdominal   Peds  Hematology  (+) anemia ,   Anesthesia Other Findings   Reproductive/Obstetrics (+) Pregnancy                             Anesthesia Physical Anesthesia Plan  ASA: III  Anesthesia Plan: Epidural   Post-op Pain Management:    Induction:   PONV Risk Score and Plan: Treatment may vary due to age or medical condition  Airway Management Planned: Natural Airway  Additional Equipment:   Intra-op Plan:   Post-operative Plan:   Informed Consent: I have reviewed the patients History and Physical, chart, labs and discussed the procedure including the risks, benefits and alternatives for the proposed anesthesia with the patient or authorized representative who has indicated his/her understanding and acceptance.       Plan Discussed with:   Anesthesia Plan Comments:         Anesthesia Quick Evaluation

## 2020-12-15 NOTE — Lactation Note (Signed)
This note was copied from a baby's chart. Lactation Consultation Note  Patient Name: Peggy Conley Date: 12/15/2020 Reason for consult: Initial assessment;Mother's request;Early term 37-38.6wks;Other (Comment) (GHTN on Mg) Age:35 hours   Mom stated she tried latching but he would not sustain the latch. Mom just fed 5 ml of EBM via bottle 2 hrs prior and infant sleeping during the visit.   Mom had questions about different positions, fecal/urine output based on hrs of age and how to get a deep latch. We reviewed these times in her [facility] brochure.   Mom wants to rest at this time but would like follow up later to assess the latch and check her flange size.   Plan 1. To feed based on cues 8-12x in 24 hr period no more than 3 hrs without an attempt. Mom to offer both breasts, look for swallows with breast compression.             2. If infant unable to latch, Mom to offer EBM via spoon or finger feeding.             3. Mom to pump DEBP q 3 hrs for 15 minutes             4. Mom can continue to offer EBM via supplementation via paced bottle feeding using slow flow nipple provided.              5. LC brochure of inpatient and outpatient services reviewed.               6. I and O sheet reviewed.   All questions answered at the end of the visit.    Maternal Data Has patient been taught Hand Expression?: Yes Does the patient have breastfeeding experience prior to this delivery?: Yes How long did the patient breastfeed?: breast fed first child for 3 weeks, followed by pump and bottle fed BM for 6 months.  Feeding Mother's Current Feeding Choice: Breast Milk Nipple Type: Slow - flow  LATCH Score Latch: Too sleepy or reluctant, no latch achieved, no sucking elicited.  Audible Swallowing: None  Type of Nipple: Everted at rest and after stimulation  Comfort (Breast/Nipple): Soft / non-tender  Hold (Positioning): Assistance needed to correctly position infant at breast and  maintain latch.  LATCH Score: 5   Lactation Tools Discussed/Used Tools: Pump;Flanges Flange Size:  (Mom declined review of flange size at this time since she is resting.) Breast pump type: Double-Electric Breast Pump Pump Education: Setup, frequency, and cleaning;Milk Storage Reason for Pumping: increase stimulation Pumping frequency: every 3 hrs for 15 minutes  Interventions Interventions: Breast feeding basics reviewed;Support pillows;Education;Skin to skin;Expressed milk;Hand express;DEBP;Breast compression  Discharge Pump: Personal WIC Program: No  Consult Status Consult Status: Follow-up Date: 12/16/20 Follow-up type: In-patient    Peggy Conley  Peggy Conley 12/15/2020, 8:28 PM

## 2020-12-15 NOTE — Lactation Note (Signed)
This note was copied from a baby's chart. Lactation Consultation Note  Patient Name: Peggy Conley [id number] Date: 12/15/2020 Reason for consult: L&D Initial assessment;Early term 37-38.6wks Age:35 hours  Visited with mom of 1 hours old ETI female, she's a P2 but not experienced BF, she exclusively pumped and bottle feed her first baby for 5 months. LC assisted with hand expression (mom has great colostrum) and finger fed baby.   LC also assisted with latch, but baby would not suck, he just opened her mouth and cried while at the breast. LC left FOB and baby doing STS. Reviewed normal newborn behavior, cluster feeding, feeding cues and size of baby's stomach.  Feeding plan:  1. Encouraged mom to feed baby STS 8-12 times/24 hours or sooner if feeding cues are present 2. Hand expression and finger/spoon feeding were also encouraged  No literature provided due to the nature of this L&D consultation. FOB present and supportive. Parents reported all questions and concerns were answered, they're both aware of LC OP services and will call PRN.  Maternal Data Has patient been taught Hand Expression?: Yes Does the patient have breastfeeding experience prior to this delivery?: Yes How long did the patient breastfeed?: 6 months  Feeding Mother's Current Feeding Choice: Breast Milk  LATCH Score Latch: Too sleepy or reluctant, no latch achieved, no sucking elicited.  Audible Swallowing: None  Type of Nipple: Everted at rest and after stimulation  Comfort (Breast/Nipple): Soft / non-tender  Hold (Positioning): Assistance needed to correctly position infant at breast and maintain latch.  LATCH Score: 5   Lactation Tools Discussed/Used    Interventions Interventions: Breast feeding basics reviewed;Education;Skin to skin;Breast massage;Hand express;Assisted with latch;Breast compression;Adjust position;Support pillows  Discharge Pump: Personal (Motif DEBP at home) [facility] Program:  No  Consult Status Consult Status: Follow-up Date: 12/15/20 Follow-up type: In-patient    Peggy Conley Venetia Constable 12/15/2020, 3:44 PM

## 2020-12-15 NOTE — Anesthesia Procedure Notes (Signed)
Epidural Patient location during procedure: OB Start time: 12/15/2020 10:00 AM End time: 12/15/2020 10:07 AM  Staffing Anesthesiologist: Marcene Duos, MD Performed: anesthesiologist   Preanesthetic Checklist Completed: patient identified, IV checked, site marked, risks and benefits discussed, surgical consent, monitors and equipment checked, pre-op evaluation and timeout performed  Epidural Patient position: sitting Prep: DuraPrep and site prepped and draped Patient monitoring: continuous pulse ox and blood pressure Approach: midline Location: L4-L5 Injection technique: LOR air  Needle:  Needle type: Tuohy  Needle gauge: 17 G Needle length: 9 cm and 9 Needle insertion depth: 8 cm Catheter type: closed end flexible Catheter size: 19 Gauge Catheter at skin depth: 13 cm Test dose: negative  Assessment Events: blood not aspirated, injection not painful, no injection resistance, no paresthesia and negative IV test

## 2020-12-16 LAB — CBC
HCT: 29.3 % — ABNORMAL LOW (ref 36.0–46.0)
Hemoglobin: 9.5 g/dL — ABNORMAL LOW (ref 12.0–15.0)
MCH: 27.7 pg (ref 26.0–34.0)
MCHC: 32.4 g/dL (ref 30.0–36.0)
MCV: 85.4 fL (ref 80.0–100.0)
Platelets: 213 10*3/uL (ref 150–400)
RBC: 3.43 MIL/uL — ABNORMAL LOW (ref 3.87–5.11)
RDW: 12.8 % (ref 11.5–15.5)
WBC: 11.2 10*3/uL — ABNORMAL HIGH (ref 4.0–10.5)
nRBC: 0 % (ref 0.0–0.2)

## 2020-12-16 MED ORDER — NIFEDIPINE ER OSMOTIC RELEASE 60 MG PO TB24
60.0000 mg | ORAL_TABLET | Freq: Every day | ORAL | Status: DC
  Administered 2020-12-16 – 2020-12-17 (×2): 60 mg via ORAL
  Filled 2020-12-16 (×2): qty 1

## 2020-12-16 NOTE — Lactation Note (Signed)
This note was copied from a baby's chart. Lactation Consultation Note  Patient Name: Peggy Conley [id number] Date: 12/16/2020 Reason for consult: Follow-up assessment;Mother's request;Early term 37-38.6wks Age:35 years   Mom had called earlier for assistance.  When LC arrived, infant was sleeping.  He had been circumcised earlier.    Mom told LC he did latch to the left side earlier but feel asleep shortly afterwards.  LC inquired if mom had pumped, she said she had a few hours ago.    Mom requested for LC to check flange size. Mom's nipples were red and slightly tender.  LC had mom begin pumping with the 27 size flange she had been using.  LC then placed a 24 on the left and mom felt the left side was more comfortable.  A 24 size flange was then placed on the right.  Mom pumped for the remainder of the session with 24's and felt it was more comfortable than previous pumps.  Nipple filled the flange without gaps/spacing around nipple.  LC reviewed pump settings with mom.  After pumping, no pinching or blanching noted on nipple area.    LC emphasized importance of not turning pump all the way up to full vacuum to try to collect more.  Mom did say she had been turning the pump up high.  Mom will try the 24 flanges since they feel more comfortable and will plan to use a lower setting like the pumping session with LC.  Plan:STS, feed with cues, and use good support when latching.   Supplement infant after BF with EBM or donor milk. Continue to pump every 2-3 hours.    Maternal Data    Feeding Mother's Current Feeding Choice: Breast Milk and Donor Milk Nipple Type: Slow - flow  LATCH Score                    Lactation Tools Discussed/Used Tools: Pump;Flanges Flange Size: 24 (decreased from 27) Breast pump type: Double-Electric Breast Pump Pumping frequency: Q3 hours Pumped volume: 0 mL  Interventions Interventions: DEBP  Discharge Pump: DEBP  Consult  Status Consult Status: Follow-up Date: 12/17/20 Follow-up type: In-patient    Maryruth Hancock [facility] 12/16/2020, 2:56 PM

## 2020-12-16 NOTE — Progress Notes (Signed)
10:13am-spoke with Dr. Tenny Craw with concern for elevated BPs, pt not symptomatic, one severe noted with return to under severe range with 2nd check. New orders initiated for Procardia 60mg .  1130am-Procardia 60 given, recheck of vitals per Mg protocol showed patient with 2 severe range pressures 15 minutes apart.  1143-spoke with OB regarding 2 severe range pressures. Per OB do not start Labetalol protocol, re-check BP in 1 hour and notify of results.  12:39-severe range pressure noted, called OB at 1244, per OB, initiate Labetalol protocol.  12:49-Labetalol 20mg  given, next recheck BP under severe range, provider at bedside. Continue to monitor  See flowsheets with BP ranges.

## 2020-12-16 NOTE — Progress Notes (Signed)
Post Partum Day 1 Subjective: no complaints, up ad lib, voiding and tolerating PO  Denies HA/vision changes  Objective: Blood pressure (!) 153/83, pulse 85, temperature 97.8 F (36.6 C), temperature source Oral, resp. rate 16, height 5\' 7"  (1.702 m), weight 113.4 kg, last menstrual period 03/23/2020, SpO2 100 %, unknown if currently breastfeeding.  Vitals:   12/16/20 1124 12/16/20 1139 12/16/20 1239 12/16/20 1303  BP: (!) 164/83 (!) 165/92 (!) 167/97 (!) 153/83  Pulse: 79 81 84 85  Resp: 17  17 16   Temp: 97.8 F (36.6 C)     TempSrc: Oral     SpO2: 100%     Weight:      Height:         Physical Exam:  General: alert, cooperative and appears stated age Lochia: appropriate Uterine Fundus: firm Incision: healing well DVT Evaluation: No evidence of DVT seen on physical exam.  Recent Labs    12/15/20 1529 12/16/20 0416  HGB 10.1* 9.5*  HCT 31.9* 29.3*    Assessment/Plan: 35 yo G2P2 S/P SVD w/ superimposed Pre-eclampsia 1) BPs in severe range. On Procardia 60 XL, did receive dose late due to not being continued in PP orders. Pt did receive IV labetalol 20mg  x 1. Mag due to be discontinued @ 24 hrs PP. BPs may increase at that time will monitor closely 2) Desires neonatal circumcision, R/B/A of procedure discussed at length. Pt understands that neonatal circumcision is not considered medically necessary and is elective. The risks include, but are not limited to bleeding, infection, damage to the penis, development of scar tissue, and having to have it redone at a later date. Pt understands theses risks and wishes to proceed     LOS: 2 days   12/18/20 12/16/2020, 2:02 PM

## 2020-12-16 NOTE — Lactation Note (Signed)
This note was copied from a baby's chart. Lactation Consultation Note  Patient Name: Peggy Conley [id number] Date: 12/16/2020 Reason for consult: Follow-up assessment;Early term 37-38.6wks (mom on Mag) Age:35 hours   Infant wrapped in blankets.  Parents express infant just sleeps and sleeps.  Infant was placed STS and LC assisted with hand expression.  Infant began cueing.  LC assisted in positioning mom/baby in laid back position.  Infant opened wide and latched for a couple of seconds before falling asleep.   LC assisted with latch using teacup hold.  Infant's lips were flanged and gape was wide while sucking.  LC encouraged mom to do STS, offer breast with cues, pump every 2-3 hours and feed back EBM to infant or donor milk after the BF.  If infant doesn't latch, bottle feed EBM or DM.  A bottle of expressed breastmilk was warmed, (5cc), and given to infant.  Donor milk is also in the room.  Supplementation guidelines reviewed.   Family was encouraged to call out for assistance with latch when needed.       Maternal Data Has patient been taught Hand Expression?: Yes  Feeding Mother's Current Feeding Choice: Breast Milk and Donor Milk  LATCH Score Latch: Too sleepy or reluctant, no latch achieved, no sucking elicited.  Audible Swallowing: None  Type of Nipple: Everted at rest and after stimulation  Comfort (Breast/Nipple): Soft / non-tender  Hold (Positioning): Assistance needed to correctly position infant at breast and maintain latch.  LATCH Score: 5   Lactation Tools Discussed/Used Tools: Pump Breast pump type: Double-Electric Breast Pump Reason for Pumping: ETI, not latching, stimulate milk supply and collect to feed back to infant  Interventions Interventions: Breast feeding basics reviewed;Skin to skin;Assisted with latch;Support pillows;Hand express  Discharge Pump: DEBP  Consult Status Consult Status: Follow-up Date: 12/17/20 Follow-up type:  In-patient    Maryruth Hancock [facility] 12/16/2020, 8:19 AM

## 2020-12-16 NOTE — Anesthesia Postprocedure Evaluation (Signed)
Anesthesia Post Note  Patient: Peggy Conley  Procedure(s) Performed: AN AD HOC LABOR EPIDURAL     Patient location during evaluation: Mother Baby Anesthesia Type: Epidural Level of consciousness: awake and alert, oriented and patient cooperative Pain management: pain level controlled Vital Signs Assessment: post-procedure vital signs reviewed and stable Respiratory status: spontaneous breathing Cardiovascular status: stable Postop Assessment: no headache, epidural receding, patient able to bend at knees and no signs of nausea or vomiting Anesthetic complications: no Comments: Pt. States she is walking.  Pt. Denies pain.    No complications documented.  Last Vitals:  Vitals:   12/16/20 0410 12/16/20 0500  BP: (!) 155/95   Pulse: 83   Resp: 16 18  Temp: 36.5 C   SpO2: 98%     Last Pain:  Vitals:   12/16/20 0630  TempSrc:   PainSc: 4    Pain Goal: Patients Stated Pain Goal: 1 (12/15/20 4098)                 Merrilyn Puma

## 2020-12-16 NOTE — Lactation Note (Signed)
This note was copied from a baby's chart. Lactation Consultation Note  Patient Name: Boy Zyla Dascenzo [id number] Date: 12/16/2020 Reason for consult: Follow-up assessment;Mother's request;Early term 37-38.6wks Age:35 hours  Mom reports pumping is going better and breastfeeding is going better.  Mom denies need for lactation assistance at this time.  Praised efforts.  Urged mom to make sure and massage/hand express and pump everytime bottle given.  Call lactation as needed.   Maternal Data    Feeding Mother's Current Feeding Choice: Breast Milk and Donor Milk Nipple Type: Slow - flow  LATCH Score                    Lactation Tools Discussed/Used Tools: Pump;Flanges Flange Size: 24 (decreased from 27) Breast pump type: Double-Electric Breast Pump Pumping frequency: Q3 hours Pumped volume: 0 mL  Interventions Interventions: DEBP  Discharge Pump: DEBP  Consult Status Consult Status: Follow-up Date: 12/17/20 Follow-up type: In-patient    Sayler Mickiewicz Michaelle Copas 12/16/2020, 6:50 PM

## 2020-12-17 MED ORDER — NIFEDIPINE ER OSMOTIC RELEASE 60 MG PO TB24
60.0000 mg | ORAL_TABLET | Freq: Every day | ORAL | Status: DC
  Administered 2020-12-18: 60 mg via ORAL
  Filled 2020-12-17: qty 1

## 2020-12-17 MED ORDER — BUTALBITAL-APAP-CAFFEINE 50-325-40 MG PO TABS
2.0000 | ORAL_TABLET | ORAL | Status: DC | PRN
  Administered 2020-12-17 (×2): 2 via ORAL
  Filled 2020-12-17 (×2): qty 2

## 2020-12-17 MED ORDER — OXYCODONE HCL 5 MG PO TABS
10.0000 mg | ORAL_TABLET | Freq: Once | ORAL | Status: AC
  Administered 2020-12-17: 10 mg via ORAL
  Filled 2020-12-17: qty 2

## 2020-12-17 MED ORDER — NIFEDIPINE ER OSMOTIC RELEASE 30 MG PO TB24: 30.0000 mg | ORAL_TABLET | Freq: Every day | ORAL | Status: DC

## 2020-12-17 MED ORDER — LISINOPRIL 10 MG PO TABS
10.0000 mg | ORAL_TABLET | Freq: Every day | ORAL | Status: DC
  Administered 2020-12-17 – 2020-12-18 (×2): 10 mg via ORAL
  Filled 2020-12-17 (×2): qty 1

## 2020-12-17 NOTE — Lactation Note (Signed)
This note was copied from a baby's chart. Lactation Consultation Note  Patient Name: Peggy Conley [id number] Date: 12/17/2020 Reason for consult: Follow-up assessment;Early term 37-38.6wks Age:35 hours   0952 - I followed up with Ms. Peggy Conley. She was latching baby Peggy Conley on her left breast in cross cradle hold upon entry. I observed her latch without assistance. She states that he's latching better today and commended the help she's received.  Ms. Peggy Conley is supplementing with her EBM and then Peggy Conley. She has been post-pumping and obtaining about 5 mls/pumping session. I praised her for her hard work and progress.  I provided a pointer about latch position with baby and encouraged her to hold the breast in a "U" hold. She verbalized understanding.  Ms. Peggy Conley will stay one more night due to headache and BP management.  Plan: Breast feed on demand 8-12 times a day. Offer both breasts in a feeding. Supplement after with EBM or ABM.  Post pump and use EBM for supplementation. Follow up with lactation as needed tonight and tomorrow.  I placed an OP referral today.   Feeding Mother's Current Feeding Choice: Breast Milk and Donor Milk Nipple Type: Slow - flow   Lactation Tools Discussed/Used Tools: Pump;Flanges Flange Size: 24;27 (Using two sizes) Breast pump type: Double-Electric Breast Pump Pump Education: Setup, frequency, and cleaning Reason for Pumping: stimulation and supplementation Pumping frequency: q8 Pumped volume: 5 mL (mls)  Interventions    Discharge Discharge Education: Outpatient recommendation;Outpatient Epic message sent Pump: DEBP  Consult Status Consult Status: Follow-up Date: 12/18/20 Follow-up type: In-patient    Walker Shadow 12/17/2020, 10:14 AM

## 2020-12-18 MED ORDER — LISINOPRIL 10 MG PO TABS: 10.0000 mg | ORAL_TABLET | Freq: Every day | ORAL | 2 refills | Status: AC

## 2020-12-18 MED ORDER — BUTALBITAL-APAP-CAFFEINE 50-325-40 MG PO TABS: 1.0000 | ORAL_TABLET | ORAL | 0 refills | Status: AC | PRN

## 2020-12-18 MED ORDER — NIFEDIPINE ER 60 MG PO TB24: 60.0000 mg | ORAL_TABLET | Freq: Every day | ORAL | 2 refills | Status: AC

## 2020-12-18 NOTE — Discharge Summary (Signed)
DISCHARGE SUMMARY    Patient Name: Peggy Conley DOB: 14-Apr-1986 MRN: [id number]  Date of admission: 12/14/2020 Delivery date:12/15/2020  Delivering provider: Vanessa Kick  Date of discharge: 12/18/2020  Admitting diagnosis: Chronic hypertension with superimposed preeclampsia [O11.9] Spontaneous vaginal delivery [O80] Intrauterine pregnancy: [redacted]w[redacted]d    Secondary diagnosis:  Active Problems:   Chronic hypertension with superimposed preeclampsia   Spontaneous vaginal delivery  Additional problems: BMI 37    Discharge diagnosis: CHTN with superimposed preeclampsia                                              Post partum procedures:MgSO4 Augmentation: AROM, Pitocin and Cytotec Complications: None  Hospital course: Induction of Labor With Vaginal Delivery   35y.o. yo G2P2002 at 363w1das admitted to the hospital 12/14/2020 for induction of labor.  Indication for induction: Preeclampsia.  Patient had an uncomplicated labor course as follows: Membrane Rupture Time/Date: 12:13 PM ,12/15/2020   Delivery Method:Vaginal, Spontaneous  Episiotomy: None  Lacerations:  None  Details of delivery can be found in separate delivery note.  Patient had a routine postpartum course. Patient is discharged home 12/18/20.  Newborn Data: Birth date:12/15/2020  Birth time:2:30 PM  Gender:Female  Living status:Living  Apgars:8 ,9  Weight:3589 g   Magnesium Sulfate received: Yes, seizure prophylaxis BMZ received: No Rhophylac:N/A MMR:N/A T-DaP:see office note Flu: N/A Transfusion:No  Physical exam  Vitals:   12/18/20 0815 12/18/20 0848 12/18/20 1117 12/18/20 1331  BP: (!) 161/89 (!) 148/86 (!) 144/85 (!) 149/86  Pulse: 82 83 86   Resp: _0 Temp: 98.3 F (36.8 C)  97.7 F (36.5 C)   TempSrc: Oral  Oral   SpO2: 100%  99%   Weight:      Height:       General: alert, cooperative and no distress Lochia: appropriate Uterine Fundus: firm DVT Evaluation: No evidence of DVT seen on  physical exam. Labs: Lab Results  Component Value Date   WBC 11.2 (H) 12/16/2020   HGB 9.5 (L) 12/16/2020   HCT 29.3 (L) 12/16/2020   MCV 85.4 12/16/2020   PLT 213 12/16/2020   CMP Latest Ref Rng & Units 12/14/2020  Glucose 70 - 99 mg/dL 106(H)  BUN 6 - 20 mg/dL 6  Creatinine 0.44 - 1.00 mg/dL 0.58  Sodium 135 - 145 mmol/L 135  Potassium 3.5 - 5.1 mmol/L 3.6  Chloride 98 - 111 mmol/L 106  CO2 22 - 32 mmol/L 23  Calcium 8.9 - 10.3 mg/dL 8.0(L)  Total Protein 6.5 - 8.1 g/dL 5.8(L)  Total Bilirubin 0.3 - 1.2 mg/dL 0.2(L)  Alkaline Phos 38 - 126 U/L 136(H)  AST 15 - 41 U/L 17  ALT 0 - 44 U/L 15   Edinburgh Score: Edinburgh Postnatal Depression Scale Screening Tool 12/17/2020  I have been able to laugh and see the funny side of things. 0  I have looked forward with enjoyment to things. 0  I have blamed myself unnecessarily when things went wrong. 1  I have been anxious or worried for no good reason. 0  I have felt scared or panicky for no good reason. 0  Things have been getting on top of me. 0  I have been so unhappy that I have had difficulty sleeping. 0  I have felt sad or miserable. 0  I have been  so unhappy that I have been crying. 0  The thought of harming myself has occurred to me. 0  Edinburgh Postnatal Depression Scale Total 1      After visit meds:  Allergies as of 12/18/2020      Reactions   Azithromycin Hives      Medication List    STOP taking these medications   oxyCODONE-acetaminophen 5-325 MG tablet Commonly known as: Roxicet     TAKE these medications   butalbital-acetaminophen-caffeine 50-325-40 MG tablet Commonly known as: FIORICET Take 1 tablet by mouth every 4 (four) hours as needed for headache.   CITRANATAL HARMONY PO Take 2 tablets by mouth daily.   docusate sodium 100 MG capsule Commonly known as: Colace Take 1 capsule (100 mg total) by mouth 2 (two) times daily.   ibuprofen 600 MG tablet Commonly known as: ADVIL Take 1 tablet (600  mg total) by mouth every 6 (six) hours as needed.   lisinopril 10 MG tablet Commonly known as: ZESTRIL Take 1 tablet (10 mg total) by mouth daily. Start taking on: Dec 19, 2020   mometasone 50 MCG/ACT nasal spray Commonly known as: NASONEX Place 2 sprays into the nose daily.   NIFEdipine 60 MG 24 hr tablet Commonly known as: ADALAT CC Take 1 tablet (60 mg total) by mouth daily. Start taking on: Dec 19, 2020        Discharge home in stable condition Infant Feeding: see peds notes Infant Disposition:home with mother Discharge instruction: per After Visit Summary and Postpartum booklet. Activity: Advance as tolerated. Pelvic rest for 6 weeks.  Diet: routine diet Anticipated Birth Control: Unsure Postpartum Appointment:schedule appt tomrrow Additional Postpartum F/U: Postpartum Depression checkup Future Appointments:No future appointments. Follow up Visit:  Follow-up Information    Ob/Gyn, [facility]. Go to.   Why: Appt schedule tomorrow for BP check Contact information: [place] [address] [phone]                   12/18/2020 Allyn Kenner, DO

## 2020-12-18 NOTE — Lactation Note (Signed)
This note was copied from a baby's chart. Lactation Consultation Note Mom and baby to d/c today. Baby is at 8% wt loss; output is wnl. Mother indicates that baby is waking for feedings. She is comfortable with latching. Mother is also pumping prn and has a pump at home for use. She denies breast pain or trauma. We reviewed feeding expectations and strategies to avoid engorgement. Mother and infant to present at peds for wt check tomorrow.  Patient Name: [name] [id number] Date: 12/18/2020 Reason for consult: Early term 37-38.6wks;Follow-up assessment;Infant weight loss Age:48 hours  Maternal Data  Mother endorses +breast changes today  Feeding Mother's Current Feeding Choice: Breast Milk and Donor Milk  Interventions Interventions: Education;Breast feeding basics reviewed  Discharge Discharge Education: Engorgement and breast care;Warning signs for feeding baby;Outpatient recommendation  Consult Status Consult Status: Follow-up Follow-up type: In-patient   Elder Negus, MA IBCLC 12/18/2020, 8:08 AM

## 2020-12-18 NOTE — Progress Notes (Signed)
Patient is eating, ambulating, voiding.  Pain control is good.  Pt states she's had intermittent migraines d/t procardia treatment.  She denies current HA, vision change.  No other pain.  She is very much wanting to go home and is upset about the delay d/t severe range BP this morning.  She states she was on the phone with her daughter's school at the time of the BP check and states she was upset and that's why it was elevated, when rechecked it was mild again.  In addition, she had not yet received her morning doses of BP meds.  Vitals:   12/17/20 2332 12/18/20 0616 12/18/20 0815 12/18/20 0848  BP: 140/83 (!) 147/83 (!) 161/89 (!) 148/86  Pulse: 90 83 82 83  Resp: 18 18 16 18   Temp: 98.3 F (36.8 C) 98 F (36.7 C) 98.3 F (36.8 C)   TempSrc: Oral Oral Oral   SpO2: 99% 100% 100%   Weight:      Height:        Fundus firm Abd: soft, nontender Ext: no calf tenderness  Lab Results  Component Value Date   WBC 11.2 (H) 12/16/2020   HGB 9.5 (L) 12/16/2020   HCT 29.3 (L) 12/16/2020   MCV 85.4 12/16/2020   PLT 213 12/16/2020    --/--/A POS (05/12 1140)  A/P Post partum day 3 s/p IOL for superimposed preeclampsia on CHTN S/p MgSO4, currently on Procardia XL 60dq and Lisinopril 10mg  qd.  As above, am BP checked priro to BP meds while pt was on the phone.  Recheck was mild range.  Pt desiring d/c home and has not other symptoms.  Advised pt will check several more BPs and if normal to mild range without any symptoms, will d/c home with BP check tomorrow morning. Pt requests Rx for fioricet to be used for migraines associate with procardia, plan is to overtime wean of procardia.  Discussed plan with RN, she will check BP now and in 2 hours, if both normal will d/c home.  Routine care.      10-31-1990

## 2020-12-18 NOTE — Progress Notes (Signed)
Pt discharged to home with husband.  Condition stable.  Pt ambulated to car with Ned Clines, RN.  No equipment for home ordered at discharge.

## 2020-12-29 DIAGNOSIS — R102 Pelvic and perineal pain: Secondary | ICD-10-CM | POA: Diagnosis not present

## 2021-03-29 ENCOUNTER — Other Ambulatory Visit: Payer: Self-pay

## 2021-03-29 ENCOUNTER — Emergency Department (HOSPITAL_BASED_OUTPATIENT_CLINIC_OR_DEPARTMENT_OTHER): Payer: BC Managed Care – PPO

## 2021-03-29 ENCOUNTER — Encounter (HOSPITAL_BASED_OUTPATIENT_CLINIC_OR_DEPARTMENT_OTHER): Payer: Self-pay

## 2021-03-29 ENCOUNTER — Emergency Department (HOSPITAL_BASED_OUTPATIENT_CLINIC_OR_DEPARTMENT_OTHER)
Admission: EM | Admit: 2021-03-29 | Discharge: 2021-03-29 | Disposition: A | Discharge status: Home / Self Care | Payer: BC Managed Care – PPO | Attending: Emergency Medicine | Admitting: Emergency Medicine

## 2021-03-29 DIAGNOSIS — T148XXA Other injury of unspecified body region, initial encounter: Secondary | ICD-10-CM

## 2021-03-29 DIAGNOSIS — S6992XA Unspecified injury of left wrist, hand and finger(s), initial encounter: Secondary | ICD-10-CM | POA: Diagnosis not present

## 2021-03-29 DIAGNOSIS — S8992XA Unspecified injury of left lower leg, initial encounter: Secondary | ICD-10-CM | POA: Diagnosis not present

## 2021-03-29 DIAGNOSIS — S60512A Abrasion of left hand, initial encounter: Secondary | ICD-10-CM | POA: Diagnosis not present

## 2021-03-29 DIAGNOSIS — S80212A Abrasion, left knee, initial encounter: Secondary | ICD-10-CM | POA: Diagnosis not present

## 2021-03-29 DIAGNOSIS — W1839XA Other fall on same level, initial encounter: Secondary | ICD-10-CM | POA: Diagnosis not present

## 2021-03-29 DIAGNOSIS — S81812A Laceration without foreign body, left lower leg, initial encounter: Secondary | ICD-10-CM

## 2021-03-29 DIAGNOSIS — S80211A Abrasion, right knee, initial encounter: Secondary | ICD-10-CM | POA: Insufficient documentation

## 2021-03-29 DIAGNOSIS — W19XXXA Unspecified fall, initial encounter: Secondary | ICD-10-CM

## 2021-03-29 DIAGNOSIS — S81012A Laceration without foreign body, left knee, initial encounter: Secondary | ICD-10-CM | POA: Insufficient documentation

## 2021-03-29 IMAGING — DX DG KNEE COMPLETE 4+V*L*
4 series · 4 of 4 positions shown · non-contrast
Comparison: Left tibia/fibula [DATE]

CLINICAL DATA: Trip and fall injury.  Abrasion to the knee.

EXAM:
LEFT KNEE - COMPLETE 4+ VIEW

[knee ap]
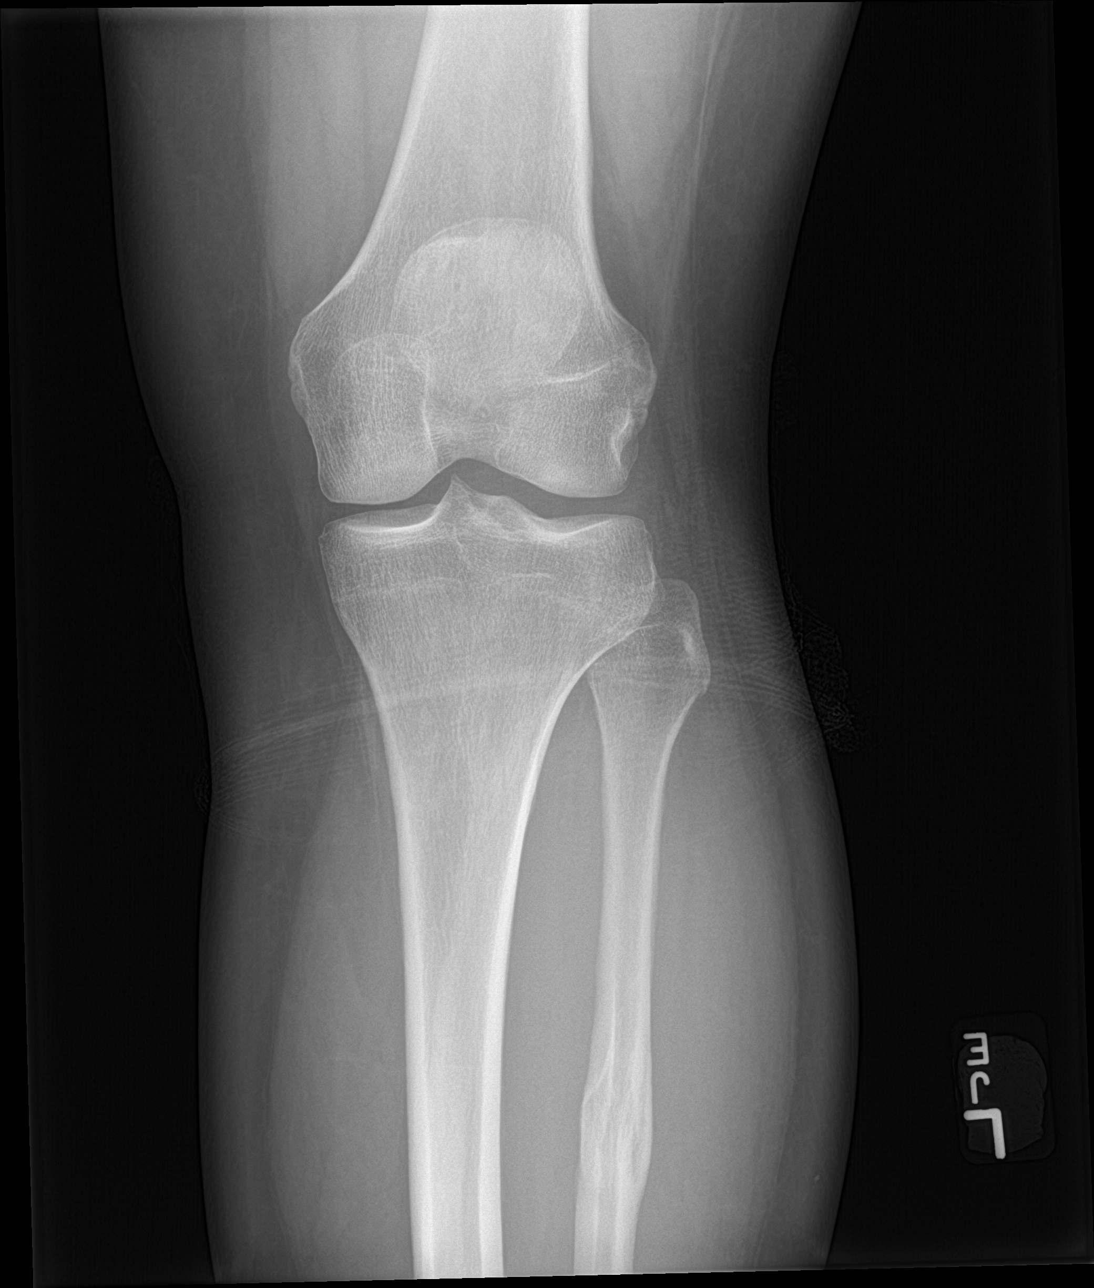

[knee lat]
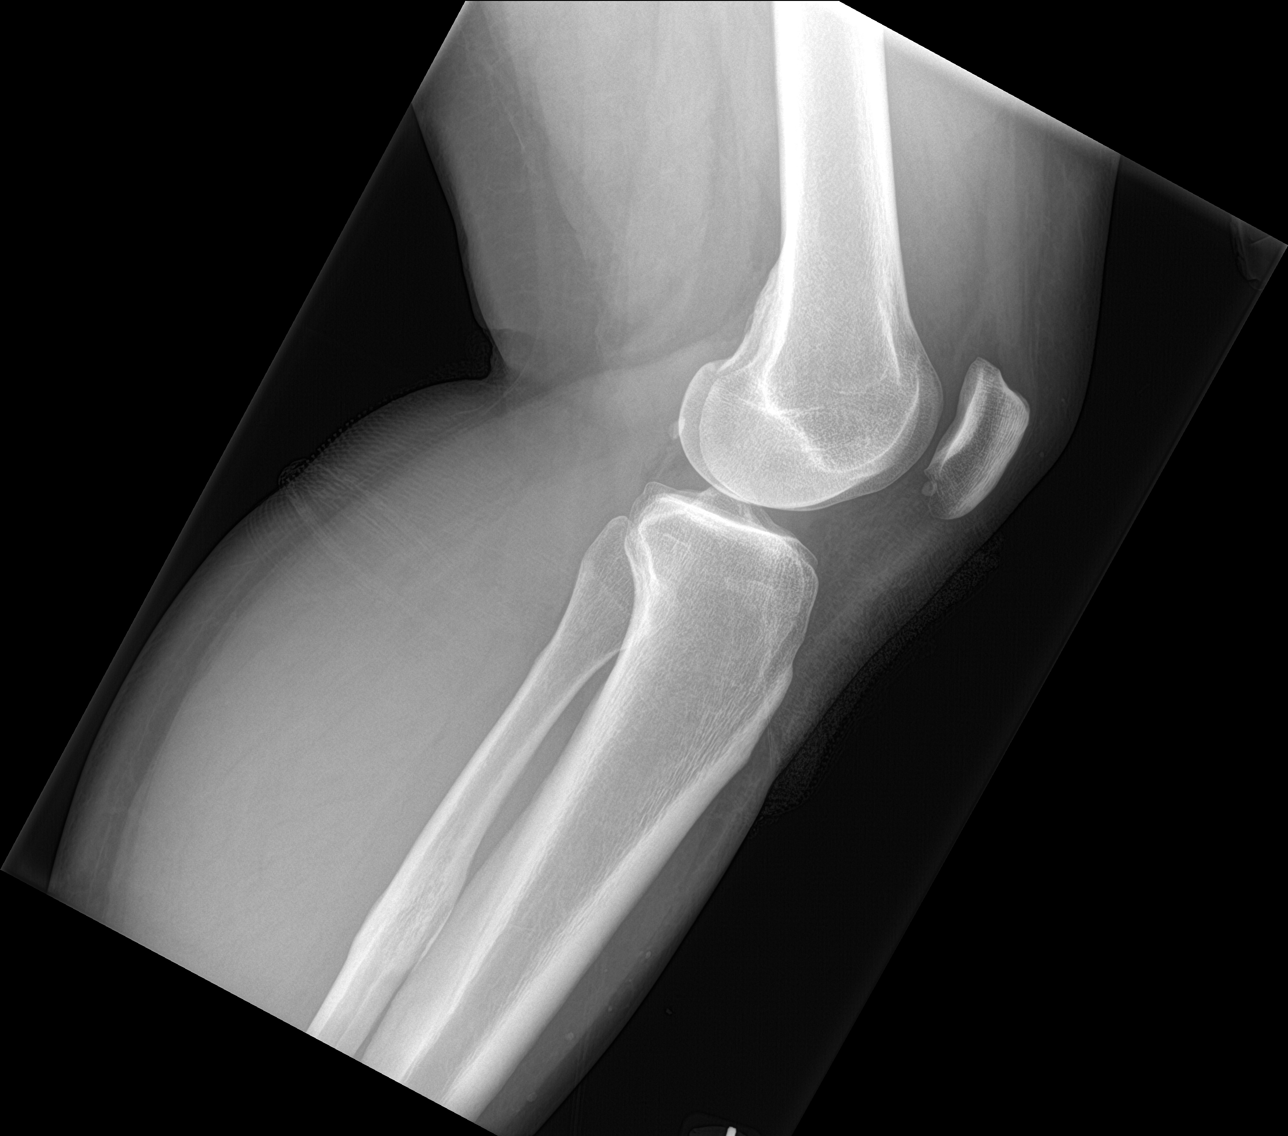

[knee obl (1 of 2)]
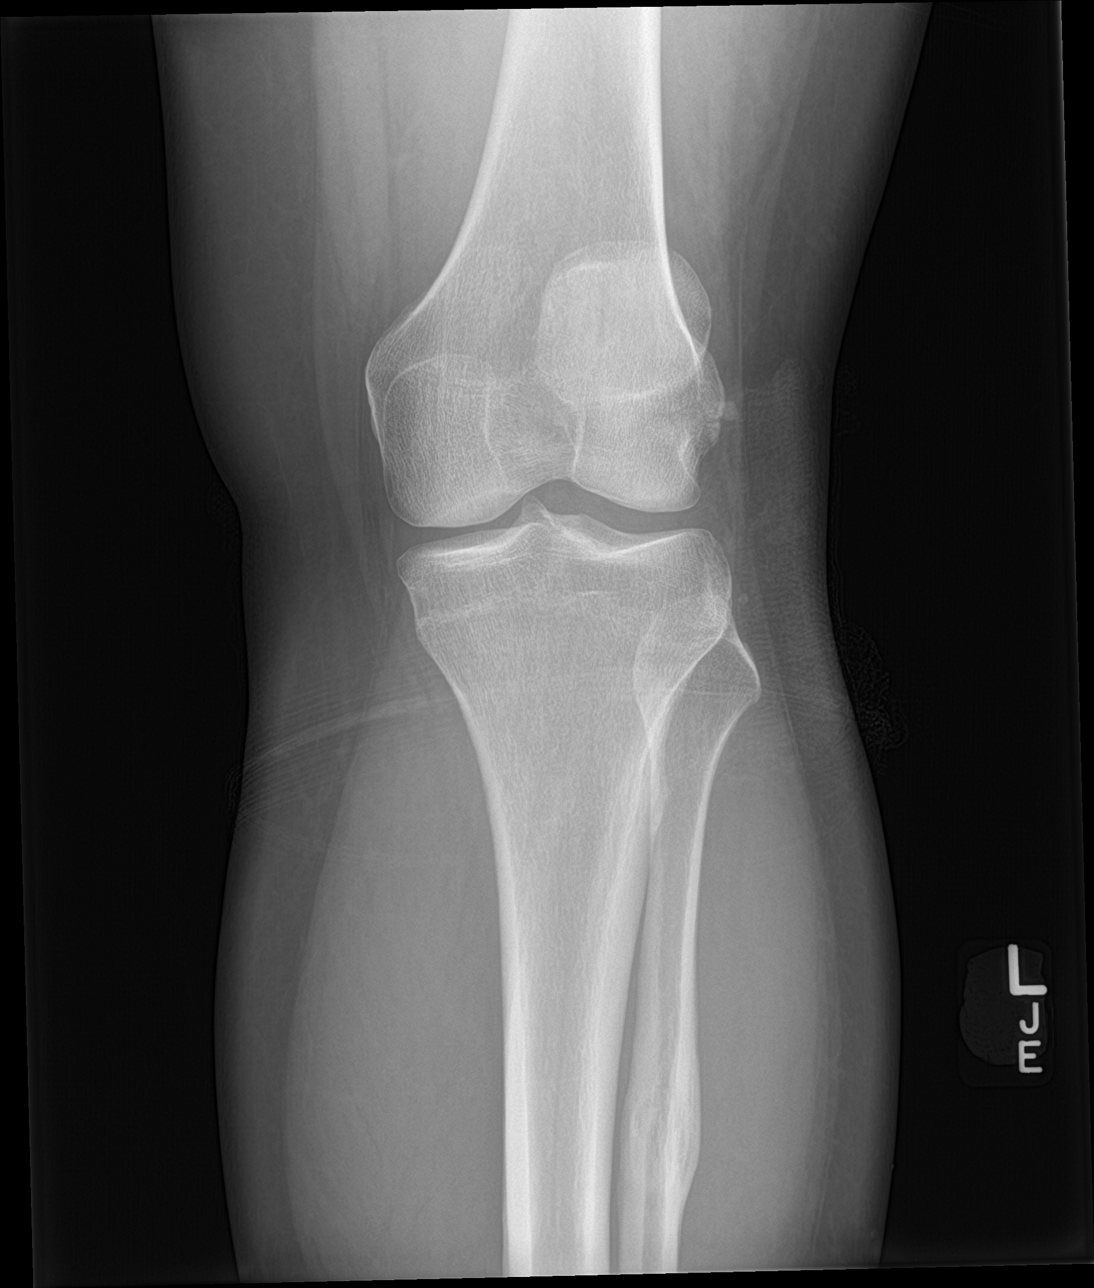

[knee obl (2 of 2)]
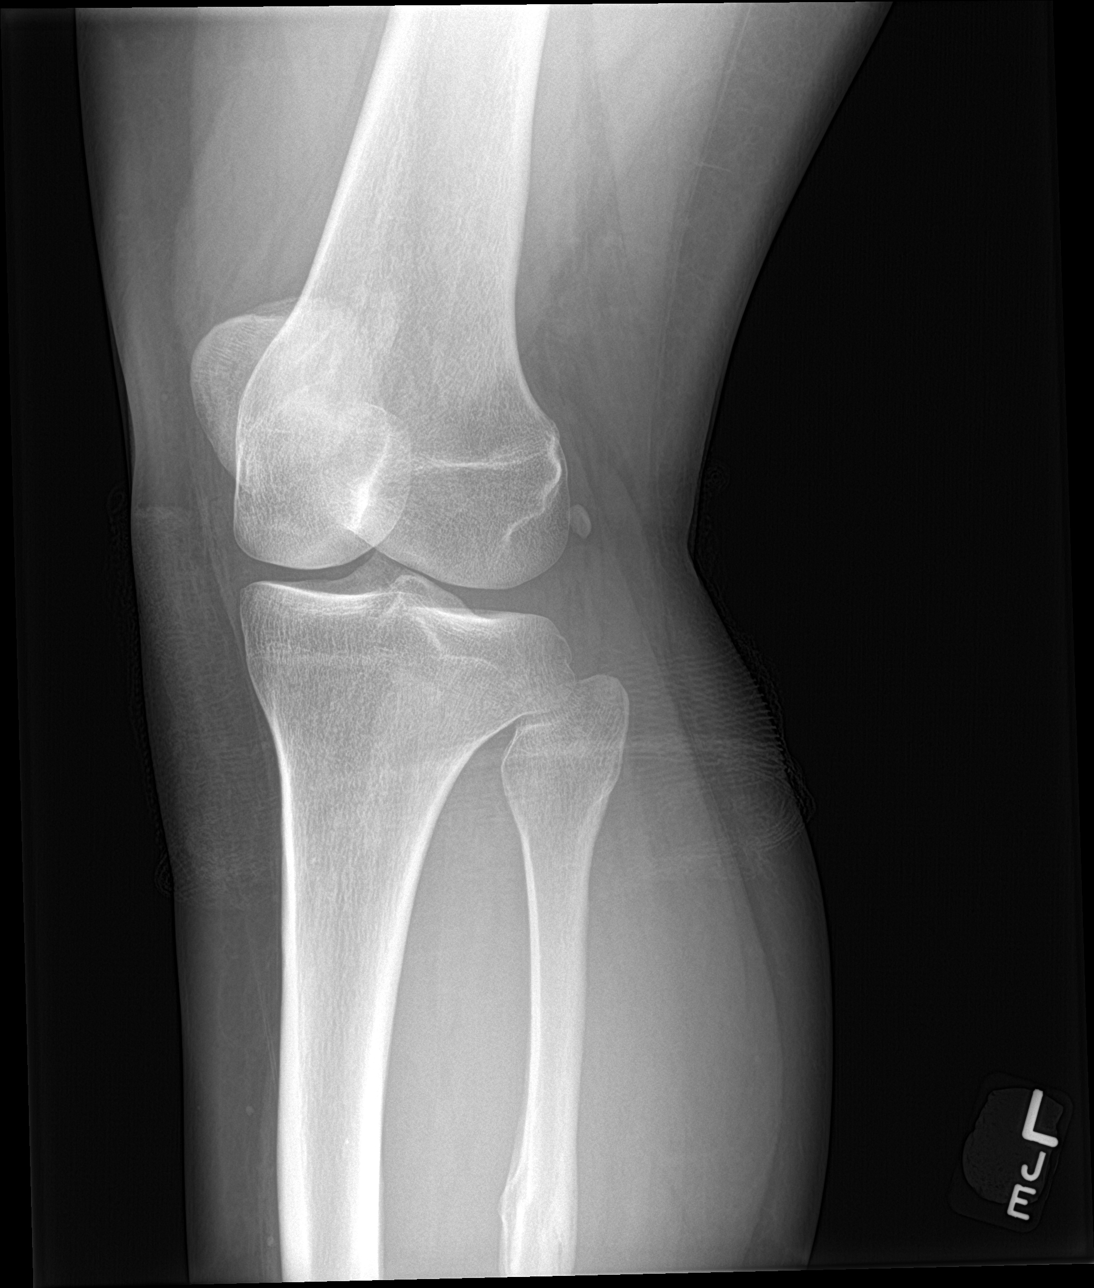

[4 of 4 positions shown; findings below may reference images not displayed]

FINDINGS: Left knee appears intact. No evidence of acute fracture or
dislocation. No focal bone lesion or bone destruction. Small
ununited ossicle inferior to the patella. Old healed fracture
deformity of the midshaft left fibula. No significant effusions.
Soft tissues are unremarkable.
IMPRESSION: No acute bony abnormalities.

## 2021-03-29 MED ORDER — LIDOCAINE-EPINEPHRINE (PF) 2 %-1:200000 IJ SOLN
10.0000 mL | Freq: Once | INTRAMUSCULAR | Status: AC
  Administered 2021-03-29: 10 mL
  Filled 2021-03-29: qty 20

## 2021-03-29 MED ORDER — OXYCODONE-ACETAMINOPHEN 5-325 MG PO TABS
1.0000 | ORAL_TABLET | Freq: Once | ORAL | Status: AC
  Administered 2021-03-29: 1 via ORAL
  Filled 2021-03-29: qty 1

## 2021-03-29 NOTE — ED Triage Notes (Signed)
Pt arrives to ED with reports of tripping over a curb today while running and landing on left knee, pt has large abrasion to knee, area clean in triage and wet to dry dressing placed. Pt also has scrapes to left hand where she reports catching herself in fall. Pt denies hitting head, denies LOC.

## 2021-03-29 NOTE — Discharge Instructions (Addendum)
Please return to the ED in 7-10 days time for suture removal  Return sooner for any signs of infection including redness/swelling around the wound, drainage of pus, fevers > 100.4  While at home please rest, ice, and elevate your leg to help reduce pain/swelling. Apply ice as needed for pain. Take Ibuprofen and Tylenol as needed for pain.   Return to the ED for any new/worsening symptoms

## 2021-03-29 NOTE — ED Provider Notes (Signed)
[facility] EMERGENCY DEPARTMENT Provider Note   CSN: [id number] Arrival date & time: 03/29/21  1848     History Chief Complaint  Patient presents with   Fall   Laceration    Peggy Conley is a 35 y.o. female who presents to the ED today s/p mechanical fall that occurred around 6 PM.  She reports she was out running when she accidentally tripped over a curb causing her to fall onto her bilateral knees and outstretched hands.  No head injury or loss of consciousness.  She has multiple abrasions to her hands and knees however her pain is mostly to the left knee where she has the largest laceration.  Her tetanus is up-to-date as she recently gave birth.  She did not take anything for pain prior to arrival.  She has no other complaints at this time.  The history is provided by the patient and medical records.      Past Medical History:  Diagnosis Date   Head concussion    x5   Injury of frontal lobe (HCC)    second degree concussion 2006    Patient Active Problem List   Diagnosis Date Noted   Spontaneous vaginal delivery 12/15/2020   Chronic hypertension with superimposed preeclampsia 12/14/2020   Normal labor 07/25/2014    Past Surgical History:  Procedure Laterality Date   WISDOM TOOTH EXTRACTION  2005     OB History     Gravida  2   Para  2   Term  2   Preterm      AB      Living  2      SAB      IAB      Ectopic      Multiple  0   Live Births  2           Family History  Problem Relation Age of Onset   Cancer Maternal Grandmother     Social History   Tobacco Use   Smoking status: Never   Smokeless tobacco: Never  Vaping Use   Vaping Use: Never used  Substance Use Topics   Alcohol use: No   Drug use: No    Home Medications Prior to Admission medications   Medication Sig Start Date End Date Taking? Authorizing Provider  butalbital-acetaminophen-caffeine (FIORICET) 50-325-40 MG tablet Take 1 tablet by mouth  every 4 (four) hours as needed for headache. 12/18/20   Philip Aspen, DO  docusate sodium (COLACE) 100 MG capsule Take 1 capsule (100 mg total) by mouth 2 (two) times daily. 07/27/14   Waynard Reeds, MD  ibuprofen (ADVIL,MOTRIN) 600 MG tablet Take 1 tablet (600 mg total) by mouth every 6 (six) hours as needed. Patient not taking: Reported on 09/28/2020 07/27/14   Waynard Reeds, MD  lisinopril (ZESTRIL) 10 MG tablet Take 1 tablet (10 mg total) by mouth daily. 12/19/20   Philip Aspen, DO  mometasone (NASONEX) 50 MCG/ACT nasal spray Place 2 sprays into the nose daily.    [provider]  NIFEdipine (ADALAT CC) 60 MG 24 hr tablet Take 1 tablet (60 mg total) by mouth daily. 12/19/20   Philip Aspen, DO  Prenat w/o A-[id number]-[id number]-FA-DHA (CITRANATAL HARMONY PO) Take 2 tablets by mouth daily.    [provider]    Allergies    Azithromycin  Review of Systems   Review of Systems  Constitutional:  Negative for chills and fever.  Musculoskeletal:  Positive for arthralgias.  Skin:  Positive for wound.  Neurological:  Negative for syncope and headaches.  All other systems reviewed and are negative.  Physical Exam Updated Vital Signs BP 131/89 (BP Location: Left Arm)   Pulse 75   Temp 98.6 F (37 C) (Oral)   Resp 17   Ht 5\' 7"  (1.702 m)   Wt 102.1 kg   SpO2 100%   Breastfeeding Yes   BMI 35.24 kg/m   Physical Exam Vitals and nursing note reviewed.  Constitutional:      Appearance: She is not ill-appearing or diaphoretic.  HENT:     Head: Normocephalic and atraumatic.     Comments: No signs of head injury Eyes:     Conjunctiva/sclera: Conjunctivae normal.  Cardiovascular:     Rate and Rhythm: Normal rate and regular rhythm.     Pulses: Normal pulses.  Pulmonary:     Effort: Pulmonary effort is normal.     Breath sounds: Normal breath sounds.  Abdominal:     Palpations: Abdomen is soft.     Tenderness: There is no abdominal tenderness.  Musculoskeletal:      Cervical back: Neck supple.     Comments: 4 cm V shaped gaping laceration to L knee, mild active bleeding with surrounding abrasion. Associated TTP. ROM intact to knee. 2+ DP pulse.   Small abrasion to R knee without TTP. Additional abrasion to palm of L hand without TTP.   Skin:    General: Skin is warm and dry.  Neurological:     Mental Status: She is alert.    ED Results / Procedures / Treatments   Labs (all labs ordered are listed, but only abnormal results are displayed) Labs Reviewed - No data to display  EKG None  Radiology DG Wrist Complete Left  Result Date: 03/29/2021 CLINICAL DATA:  Trip and fall injury today. Scraped to the left hand. EXAM: LEFT WRIST - COMPLETE 3+ VIEW COMPARISON:  Left hand 02/02/2010 FINDINGS: There is no evidence of fracture or dislocation. There is no evidence of arthropathy or other focal bone abnormality. Soft tissues are unremarkable. IMPRESSION: Negative. Electronically Signed   By: 04/05/2010 M.D.   On: 03/29/2021 19:38   DG Knee Complete 4 Views Left  Result Date: 03/29/2021 CLINICAL DATA:  Trip and fall injury.  Abrasion to the knee. EXAM: LEFT KNEE - COMPLETE 4+ VIEW COMPARISON:  Left tibia/fibula 01/28/2006 FINDINGS: Left knee appears intact. No evidence of acute fracture or dislocation. No focal bone lesion or bone destruction. 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 Old healed fracture deformity of the midshaft left fibula. No significant effusions. Soft tissues are unremarkable. IMPRESSION: No acute bony abnormalities. Electronically Signed   By: 01/30/2006 M.D.   On: 03/29/2021 19:40    Procedures .08/27/2022Laceration Repair  Date/Time: 03/29/2021 10:26 PM Performed by: 03/31/2021, PA-C Authorized by: Tanda Rockers, PA-C   Consent:    Consent obtained:  Verbal   Consent given by:  Patient   Risks discussed:  Infection, pain and poor cosmetic result Anesthesia:    Anesthesia method:  Local infiltration   Local  anesthetic:  Lidocaine 2% WITH epi Laceration details:    Location:  Leg   Leg location:  L knee   Length (cm):  4   Depth (mm):  4 Pre-procedure details:    Preparation:  Patient was prepped and draped in usual sterile fashion Treatment:    Area cleansed with:  Povidone-iodine   Irrigation solution:  Sterile saline Skin repair:  Repair method:  Sutures   Suture size:  3-0   Suture material:  Prolene   Suture technique:  Simple interrupted   Number of sutures:  5 Approximation:    Approximation:  Close Repair type:    Repair type:  Intermediate Post-procedure details:    Dressing:  Non-adherent dressing   Procedure completion:  Tolerated well, no immediate complications   Medications Ordered in ED Medications  lidocaine-EPINEPHrine (XYLOCAINE W/EPI) 2 %-1:200000 (PF) injection 10 mL (10 mLs Infiltration Given 03/29/21 2138)  oxyCODONE-acetaminophen (PERCOCET/ROXICET) 5-325 MG per tablet 1 tablet (1 tablet Oral Given 03/29/21 2138)    ED Course  I have reviewed the triage vital signs and the nursing notes.  Pertinent labs & imaging results that were available during my care of the patient were reviewed by me and considered in my medical decision making (see chart for details).    MDM Rules/Calculators/A&P                           35 year old female who presents to the ED today status post mechanical fall while running.  She sustained multiple abrasions to her bilateral knees and bilateral hands however her pain is mostly to the left knee she sustained a large laceration to same.  Tetanus up-to-date.  No head injury or loss of consciousness.  On arrival to the ED vitals are stable.  She had x-ray done of her left knee as well as her left wrist without any acute findings.  On my exam she has large 4 cm laceration, gaping, to the left knee that will require sutures.  Feel she requires sutures anywhere else.  Have recommended bacitracin for the other abrasions.  Was placed  without difficulty.  Patient discharged home at this time.  She is instructed to return to the ED in 7 days for suture removal. RICE therapy and Ibuprofen/Tylenol PRN for pain.   This note was prepared using Dragon voice recognition software and may include unintentional dictation errors due to the inherent limitations of voice recognition software.   Final Clinical Impression(s) / ED Diagnoses Final diagnoses:  Fall, initial encounter  Laceration of left lower extremity, initial encounter  Abrasion    Rx / DC Orders ED Discharge Orders     None        Discharge Instructions      Please return to the ED in 7-10 days time for suture removal  Return sooner for any signs of infection including redness/swelling around the wound, drainage of pus, fevers > 100.4  While at home please rest, ice, and elevate your leg to help reduce pain/swelling. Apply ice as needed for pain. Take Ibuprofen and Tylenol as needed for pain.   Return to the ED for any new/worsening symptoms       Tanda Rockers, Cordelia Poche 03/29/21 2226    Charlynne Pander, MD 04/04/21 2139

## 2021-09-21 ENCOUNTER — Other Ambulatory Visit: Payer: Self-pay | Admitting: Infectious Diseases

## 2021-09-21 DIAGNOSIS — K047 Periapical abscess without sinus: Secondary | ICD-10-CM

## 2021-09-21 MED ORDER — CLINDAMYCIN HCL 150 MG PO CAPS: 150.0000 mg | ORAL_CAPSULE | Freq: Three times a day (TID) | ORAL | 0 refills | Status: AC

## 2022-06-17 DIAGNOSIS — D2230 Melanocytic nevi of unspecified part of face: Secondary | ICD-10-CM | POA: Diagnosis not present

## 2022-06-17 DIAGNOSIS — F909 Attention-deficit hyperactivity disorder, unspecified type: Secondary | ICD-10-CM | POA: Diagnosis not present

## 2022-08-23 DIAGNOSIS — U071 COVID-19: Secondary | ICD-10-CM | POA: Diagnosis not present

## 2022-08-23 DIAGNOSIS — Z6834 Body mass index (BMI) 34.0-34.9, adult: Secondary | ICD-10-CM | POA: Diagnosis not present

## 2022-10-29 DIAGNOSIS — Z6837 Body mass index (BMI) 37.0-37.9, adult: Secondary | ICD-10-CM | POA: Diagnosis not present

## 2022-10-29 DIAGNOSIS — E559 Vitamin D deficiency, unspecified: Secondary | ICD-10-CM | POA: Diagnosis not present

## 2022-10-29 DIAGNOSIS — Z713 Dietary counseling and surveillance: Secondary | ICD-10-CM | POA: Diagnosis not present

## 2022-10-29 DIAGNOSIS — R635 Abnormal weight gain: Secondary | ICD-10-CM | POA: Diagnosis not present

## 2022-10-29 DIAGNOSIS — E669 Obesity, unspecified: Secondary | ICD-10-CM | POA: Diagnosis not present

## 2022-12-31 DIAGNOSIS — E669 Obesity, unspecified: Secondary | ICD-10-CM | POA: Diagnosis not present

## 2022-12-31 DIAGNOSIS — F909 Attention-deficit hyperactivity disorder, unspecified type: Secondary | ICD-10-CM | POA: Diagnosis not present

## 2022-12-31 DIAGNOSIS — E559 Vitamin D deficiency, unspecified: Secondary | ICD-10-CM | POA: Diagnosis not present

## 2022-12-31 DIAGNOSIS — J302 Other seasonal allergic rhinitis: Secondary | ICD-10-CM | POA: Diagnosis not present

## 2023-01-03 DIAGNOSIS — R208 Other disturbances of skin sensation: Secondary | ICD-10-CM | POA: Diagnosis not present

## 2023-01-03 DIAGNOSIS — L538 Other specified erythematous conditions: Secondary | ICD-10-CM | POA: Diagnosis not present

## 2023-01-03 DIAGNOSIS — D22122 Melanocytic nevi of left lower eyelid, including canthus: Secondary | ICD-10-CM | POA: Diagnosis not present

## 2023-03-04 DIAGNOSIS — M9902 Segmental and somatic dysfunction of thoracic region: Secondary | ICD-10-CM | POA: Diagnosis not present

## 2023-03-04 DIAGNOSIS — M9905 Segmental and somatic dysfunction of pelvic region: Secondary | ICD-10-CM | POA: Diagnosis not present

## 2023-03-04 DIAGNOSIS — M5386 Other specified dorsopathies, lumbar region: Secondary | ICD-10-CM | POA: Diagnosis not present

## 2023-03-04 DIAGNOSIS — M9903 Segmental and somatic dysfunction of lumbar region: Secondary | ICD-10-CM | POA: Diagnosis not present

## 2023-03-04 DIAGNOSIS — E669 Obesity, unspecified: Secondary | ICD-10-CM | POA: Diagnosis not present

## 2023-03-04 DIAGNOSIS — F909 Attention-deficit hyperactivity disorder, unspecified type: Secondary | ICD-10-CM | POA: Diagnosis not present

## 2023-03-04 DIAGNOSIS — M62830 Muscle spasm of back: Secondary | ICD-10-CM | POA: Diagnosis not present

## 2023-03-04 DIAGNOSIS — J302 Other seasonal allergic rhinitis: Secondary | ICD-10-CM | POA: Diagnosis not present

## 2023-06-04 DIAGNOSIS — F909 Attention-deficit hyperactivity disorder, unspecified type: Secondary | ICD-10-CM | POA: Diagnosis not present

## 2023-06-04 DIAGNOSIS — Z79899 Other long term (current) drug therapy: Secondary | ICD-10-CM | POA: Diagnosis not present

## 2023-06-04 DIAGNOSIS — E663 Overweight: Secondary | ICD-10-CM | POA: Diagnosis not present

## 2023-06-23 DIAGNOSIS — M25571 Pain in right ankle and joints of right foot: Secondary | ICD-10-CM | POA: Diagnosis not present

## 2023-06-23 DIAGNOSIS — S93491A Sprain of other ligament of right ankle, initial encounter: Secondary | ICD-10-CM | POA: Diagnosis not present

## 2023-06-24 ENCOUNTER — Ambulatory Visit: Payer: BC Managed Care – PPO

## 2023-07-08 DIAGNOSIS — M9901 Segmental and somatic dysfunction of cervical region: Secondary | ICD-10-CM | POA: Diagnosis not present

## 2023-07-08 DIAGNOSIS — M9903 Segmental and somatic dysfunction of lumbar region: Secondary | ICD-10-CM | POA: Diagnosis not present

## 2023-07-08 DIAGNOSIS — M9902 Segmental and somatic dysfunction of thoracic region: Secondary | ICD-10-CM | POA: Diagnosis not present

## 2023-07-08 DIAGNOSIS — M5386 Other specified dorsopathies, lumbar region: Secondary | ICD-10-CM | POA: Diagnosis not present

## 2023-07-08 DIAGNOSIS — M9905 Segmental and somatic dysfunction of pelvic region: Secondary | ICD-10-CM | POA: Diagnosis not present

## 2023-07-11 DIAGNOSIS — M9902 Segmental and somatic dysfunction of thoracic region: Secondary | ICD-10-CM | POA: Diagnosis not present

## 2023-07-11 DIAGNOSIS — M9905 Segmental and somatic dysfunction of pelvic region: Secondary | ICD-10-CM | POA: Diagnosis not present

## 2023-07-11 DIAGNOSIS — M9903 Segmental and somatic dysfunction of lumbar region: Secondary | ICD-10-CM | POA: Diagnosis not present

## 2023-07-11 DIAGNOSIS — M5386 Other specified dorsopathies, lumbar region: Secondary | ICD-10-CM | POA: Diagnosis not present

## 2023-07-14 DIAGNOSIS — M9905 Segmental and somatic dysfunction of pelvic region: Secondary | ICD-10-CM | POA: Diagnosis not present

## 2023-07-14 DIAGNOSIS — M5386 Other specified dorsopathies, lumbar region: Secondary | ICD-10-CM | POA: Diagnosis not present

## 2023-07-14 DIAGNOSIS — M9902 Segmental and somatic dysfunction of thoracic region: Secondary | ICD-10-CM | POA: Diagnosis not present

## 2023-07-14 DIAGNOSIS — M9903 Segmental and somatic dysfunction of lumbar region: Secondary | ICD-10-CM | POA: Diagnosis not present

## 2023-07-17 DIAGNOSIS — M9902 Segmental and somatic dysfunction of thoracic region: Secondary | ICD-10-CM | POA: Diagnosis not present

## 2023-07-17 DIAGNOSIS — M9905 Segmental and somatic dysfunction of pelvic region: Secondary | ICD-10-CM | POA: Diagnosis not present

## 2023-07-17 DIAGNOSIS — M5386 Other specified dorsopathies, lumbar region: Secondary | ICD-10-CM | POA: Diagnosis not present

## 2023-07-17 DIAGNOSIS — M9903 Segmental and somatic dysfunction of lumbar region: Secondary | ICD-10-CM | POA: Diagnosis not present

## 2023-07-23 DIAGNOSIS — M9902 Segmental and somatic dysfunction of thoracic region: Secondary | ICD-10-CM | POA: Diagnosis not present

## 2023-07-23 DIAGNOSIS — M9903 Segmental and somatic dysfunction of lumbar region: Secondary | ICD-10-CM | POA: Diagnosis not present

## 2023-07-23 DIAGNOSIS — M9905 Segmental and somatic dysfunction of pelvic region: Secondary | ICD-10-CM | POA: Diagnosis not present

## 2023-07-23 DIAGNOSIS — M5386 Other specified dorsopathies, lumbar region: Secondary | ICD-10-CM | POA: Diagnosis not present

## 2023-07-29 DIAGNOSIS — M9902 Segmental and somatic dysfunction of thoracic region: Secondary | ICD-10-CM | POA: Diagnosis not present

## 2023-07-29 DIAGNOSIS — M9905 Segmental and somatic dysfunction of pelvic region: Secondary | ICD-10-CM | POA: Diagnosis not present

## 2023-07-29 DIAGNOSIS — M5386 Other specified dorsopathies, lumbar region: Secondary | ICD-10-CM | POA: Diagnosis not present

## 2023-07-29 DIAGNOSIS — M9903 Segmental and somatic dysfunction of lumbar region: Secondary | ICD-10-CM | POA: Diagnosis not present

## 2023-08-12 DIAGNOSIS — M9902 Segmental and somatic dysfunction of thoracic region: Secondary | ICD-10-CM | POA: Diagnosis not present

## 2023-08-12 DIAGNOSIS — M9905 Segmental and somatic dysfunction of pelvic region: Secondary | ICD-10-CM | POA: Diagnosis not present

## 2023-08-12 DIAGNOSIS — M9903 Segmental and somatic dysfunction of lumbar region: Secondary | ICD-10-CM | POA: Diagnosis not present

## 2023-08-12 DIAGNOSIS — M5386 Other specified dorsopathies, lumbar region: Secondary | ICD-10-CM | POA: Diagnosis not present

## 2023-09-04 DIAGNOSIS — M5386 Other specified dorsopathies, lumbar region: Secondary | ICD-10-CM | POA: Diagnosis not present

## 2023-09-04 DIAGNOSIS — M9902 Segmental and somatic dysfunction of thoracic region: Secondary | ICD-10-CM | POA: Diagnosis not present

## 2023-09-04 DIAGNOSIS — M9903 Segmental and somatic dysfunction of lumbar region: Secondary | ICD-10-CM | POA: Diagnosis not present

## 2023-09-04 DIAGNOSIS — M9905 Segmental and somatic dysfunction of pelvic region: Secondary | ICD-10-CM | POA: Diagnosis not present

## 2023-10-02 DIAGNOSIS — M5386 Other specified dorsopathies, lumbar region: Secondary | ICD-10-CM | POA: Diagnosis not present

## 2023-10-02 DIAGNOSIS — M9903 Segmental and somatic dysfunction of lumbar region: Secondary | ICD-10-CM | POA: Diagnosis not present

## 2023-10-02 DIAGNOSIS — M9902 Segmental and somatic dysfunction of thoracic region: Secondary | ICD-10-CM | POA: Diagnosis not present

## 2023-10-02 DIAGNOSIS — M9905 Segmental and somatic dysfunction of pelvic region: Secondary | ICD-10-CM | POA: Diagnosis not present

## 2023-10-30 DIAGNOSIS — M9902 Segmental and somatic dysfunction of thoracic region: Secondary | ICD-10-CM | POA: Diagnosis not present

## 2023-10-30 DIAGNOSIS — M9905 Segmental and somatic dysfunction of pelvic region: Secondary | ICD-10-CM | POA: Diagnosis not present

## 2023-10-30 DIAGNOSIS — M9903 Segmental and somatic dysfunction of lumbar region: Secondary | ICD-10-CM | POA: Diagnosis not present

## 2023-10-30 DIAGNOSIS — M5386 Other specified dorsopathies, lumbar region: Secondary | ICD-10-CM | POA: Diagnosis not present

## 2024-01-12 DIAGNOSIS — Z79899 Other long term (current) drug therapy: Secondary | ICD-10-CM | POA: Diagnosis not present

## 2024-01-12 DIAGNOSIS — Z6829 Body mass index (BMI) 29.0-29.9, adult: Secondary | ICD-10-CM | POA: Diagnosis not present

## 2024-01-12 DIAGNOSIS — K5901 Slow transit constipation: Secondary | ICD-10-CM | POA: Diagnosis not present

## 2024-01-12 DIAGNOSIS — E663 Overweight: Secondary | ICD-10-CM | POA: Diagnosis not present

## 2024-01-12 DIAGNOSIS — Z713 Dietary counseling and surveillance: Secondary | ICD-10-CM | POA: Diagnosis not present

## 2024-01-12 DIAGNOSIS — F909 Attention-deficit hyperactivity disorder, unspecified type: Secondary | ICD-10-CM | POA: Diagnosis not present

## 2024-03-15 ENCOUNTER — Other Ambulatory Visit: Payer: Self-pay | Admitting: Medical Genetics

## 2024-04-08 ENCOUNTER — Other Ambulatory Visit

## 2024-04-12 DIAGNOSIS — Z713 Dietary counseling and surveillance: Secondary | ICD-10-CM | POA: Diagnosis not present

## 2024-04-12 DIAGNOSIS — Z6830 Body mass index (BMI) 30.0-30.9, adult: Secondary | ICD-10-CM | POA: Diagnosis not present

## 2024-05-11 DIAGNOSIS — H10022 Other mucopurulent conjunctivitis, left eye: Secondary | ICD-10-CM | POA: Diagnosis not present

## 2024-05-15 ENCOUNTER — Other Ambulatory Visit: Payer: Self-pay | Admitting: Medical Genetics

## 2024-05-15 DIAGNOSIS — Z006 Encounter for examination for normal comparison and control in clinical research program: Secondary | ICD-10-CM

## 2024-06-12 LAB — GENECONNECT MOLECULAR SCREEN: Genetic Analysis Overall Interpretation: NEGATIVE
